# Patient Record
Sex: Male | Born: 1935 | Race: White | Hispanic: No | Marital: Married | Smoking: Former smoker
Health system: Southern US, Community
[De-identification: ages and names within clinical notes are randomized; demographics above are authoritative.]

## PROBLEM LIST (undated history)

## (undated) DIAGNOSIS — J449 Chronic obstructive pulmonary disease, unspecified: Secondary | ICD-10-CM

## (undated) DIAGNOSIS — E119 Type 2 diabetes mellitus without complications: Secondary | ICD-10-CM

## (undated) DIAGNOSIS — R0600 Dyspnea, unspecified: Secondary | ICD-10-CM

## (undated) DIAGNOSIS — R918 Other nonspecific abnormal finding of lung field: Secondary | ICD-10-CM

## (undated) DIAGNOSIS — J8489 Other specified interstitial pulmonary diseases: Secondary | ICD-10-CM

## (undated) DIAGNOSIS — I10 Essential (primary) hypertension: Secondary | ICD-10-CM

## (undated) DIAGNOSIS — K219 Gastro-esophageal reflux disease without esophagitis: Secondary | ICD-10-CM

## (undated) DIAGNOSIS — J9610 Chronic respiratory failure, unspecified whether with hypoxia or hypercapnia: Secondary | ICD-10-CM

## (undated) DIAGNOSIS — C801 Malignant (primary) neoplasm, unspecified: Secondary | ICD-10-CM

## (undated) DIAGNOSIS — J45909 Unspecified asthma, uncomplicated: Secondary | ICD-10-CM

---

## 1999-02-07 ENCOUNTER — Encounter: Payer: Self-pay | Admitting: Internal Medicine

## 1999-02-07 ENCOUNTER — Ambulatory Visit (HOSPITAL_COMMUNITY): Admission: RE | Admit: 1999-02-07 | Discharge: 1999-02-07 | Payer: Self-pay | Admitting: Internal Medicine

## 2000-02-07 ENCOUNTER — Encounter: Admission: RE | Admit: 2000-02-07 | Discharge: 2000-02-07 | Payer: Self-pay | Admitting: Internal Medicine

## 2000-02-07 ENCOUNTER — Encounter: Payer: Self-pay | Admitting: Internal Medicine

## 2000-04-06 ENCOUNTER — Ambulatory Visit (HOSPITAL_COMMUNITY): Admission: RE | Admit: 2000-04-06 | Discharge: 2000-04-06 | Payer: Self-pay | Admitting: *Deleted

## 2000-08-28 ENCOUNTER — Encounter: Admission: RE | Admit: 2000-08-28 | Discharge: 2000-08-28 | Payer: Self-pay | Admitting: *Deleted

## 2000-09-18 ENCOUNTER — Ambulatory Visit (HOSPITAL_COMMUNITY): Admission: RE | Admit: 2000-09-18 | Discharge: 2000-09-18 | Payer: Self-pay | Admitting: *Deleted

## 2000-10-04 ENCOUNTER — Encounter: Admission: RE | Admit: 2000-10-04 | Discharge: 2000-10-04 | Payer: Self-pay | Admitting: Internal Medicine

## 2000-10-04 ENCOUNTER — Encounter: Payer: Self-pay | Admitting: Internal Medicine

## 2000-10-15 ENCOUNTER — Encounter: Payer: Self-pay | Admitting: Internal Medicine

## 2000-10-15 ENCOUNTER — Encounter: Admission: RE | Admit: 2000-10-15 | Discharge: 2000-10-15 | Payer: Self-pay | Admitting: Internal Medicine

## 2000-11-08 ENCOUNTER — Ambulatory Visit: Admission: RE | Admit: 2000-11-08 | Discharge: 2000-11-08 | Payer: Self-pay | Admitting: Pulmonary Disease

## 2003-08-25 ENCOUNTER — Encounter: Admission: RE | Admit: 2003-08-25 | Discharge: 2003-08-25 | Payer: Self-pay | Admitting: Internal Medicine

## 2003-09-01 ENCOUNTER — Encounter: Admission: RE | Admit: 2003-09-01 | Discharge: 2003-09-01 | Payer: Self-pay | Admitting: Internal Medicine

## 2003-09-12 ENCOUNTER — Inpatient Hospital Stay (HOSPITAL_COMMUNITY): Admission: EM | Admit: 2003-09-12 | Discharge: 2003-09-18 | Payer: Self-pay | Admitting: Emergency Medicine

## 2003-09-17 ENCOUNTER — Encounter (INDEPENDENT_AMBULATORY_CARE_PROVIDER_SITE_OTHER): Payer: Self-pay | Admitting: *Deleted

## 2004-05-04 ENCOUNTER — Ambulatory Visit: Payer: Self-pay | Admitting: Pulmonary Disease

## 2004-06-07 ENCOUNTER — Ambulatory Visit: Payer: Self-pay | Admitting: Pulmonary Disease

## 2004-06-14 ENCOUNTER — Ambulatory Visit: Payer: Self-pay | Admitting: Pulmonary Disease

## 2004-06-23 ENCOUNTER — Ambulatory Visit: Payer: Self-pay | Admitting: Pulmonary Disease

## 2004-07-07 ENCOUNTER — Ambulatory Visit: Payer: Self-pay | Admitting: Pulmonary Disease

## 2004-08-05 ENCOUNTER — Ambulatory Visit: Payer: Self-pay | Admitting: Pulmonary Disease

## 2004-09-09 ENCOUNTER — Ambulatory Visit: Payer: Self-pay | Admitting: Cardiology

## 2004-09-15 ENCOUNTER — Ambulatory Visit: Payer: Self-pay | Admitting: Pulmonary Disease

## 2004-10-14 ENCOUNTER — Ambulatory Visit: Payer: Self-pay | Admitting: Pulmonary Disease

## 2004-10-17 ENCOUNTER — Encounter: Admission: RE | Admit: 2004-10-17 | Discharge: 2004-10-17 | Payer: Self-pay | Admitting: Internal Medicine

## 2004-10-31 ENCOUNTER — Encounter: Admission: RE | Admit: 2004-10-31 | Discharge: 2004-10-31 | Payer: Self-pay | Admitting: Internal Medicine

## 2004-11-01 ENCOUNTER — Ambulatory Visit: Payer: Self-pay | Admitting: Pulmonary Disease

## 2004-11-15 ENCOUNTER — Ambulatory Visit: Payer: Self-pay | Admitting: Pulmonary Disease

## 2004-11-29 ENCOUNTER — Ambulatory Visit: Payer: Self-pay | Admitting: Internal Medicine

## 2004-12-15 ENCOUNTER — Ambulatory Visit: Payer: Self-pay | Admitting: Pulmonary Disease

## 2005-01-05 ENCOUNTER — Ambulatory Visit: Payer: Self-pay | Admitting: Pulmonary Disease

## 2005-01-23 ENCOUNTER — Ambulatory Visit: Payer: Self-pay | Admitting: Internal Medicine

## 2005-02-10 ENCOUNTER — Ambulatory Visit: Payer: Self-pay | Admitting: Internal Medicine

## 2005-02-27 ENCOUNTER — Ambulatory Visit: Payer: Self-pay | Admitting: Pulmonary Disease

## 2005-03-20 ENCOUNTER — Ambulatory Visit: Payer: Self-pay | Admitting: Pulmonary Disease

## 2005-04-03 ENCOUNTER — Ambulatory Visit: Payer: Self-pay | Admitting: Pulmonary Disease

## 2005-04-20 ENCOUNTER — Ambulatory Visit: Payer: Self-pay | Admitting: Pulmonary Disease

## 2005-04-21 ENCOUNTER — Ambulatory Visit: Payer: Self-pay | Admitting: Internal Medicine

## 2005-05-08 ENCOUNTER — Ambulatory Visit: Payer: Self-pay | Admitting: Pulmonary Disease

## 2005-05-23 ENCOUNTER — Ambulatory Visit: Payer: Self-pay | Admitting: Pulmonary Disease

## 2005-07-12 ENCOUNTER — Ambulatory Visit: Payer: Self-pay | Admitting: Pulmonary Disease

## 2005-08-30 ENCOUNTER — Ambulatory Visit: Payer: Self-pay | Admitting: Pulmonary Disease

## 2005-12-04 ENCOUNTER — Ambulatory Visit: Payer: Self-pay | Admitting: Pulmonary Disease

## 2006-01-29 ENCOUNTER — Ambulatory Visit: Payer: Self-pay | Admitting: Internal Medicine

## 2006-02-26 ENCOUNTER — Ambulatory Visit: Payer: Self-pay | Admitting: Pulmonary Disease

## 2006-02-28 ENCOUNTER — Ambulatory Visit: Payer: Self-pay | Admitting: Cardiology

## 2006-03-19 ENCOUNTER — Ambulatory Visit: Payer: Self-pay | Admitting: Pulmonary Disease

## 2006-04-11 ENCOUNTER — Ambulatory Visit: Payer: Self-pay | Admitting: Pulmonary Disease

## 2006-05-17 ENCOUNTER — Ambulatory Visit: Payer: Self-pay | Admitting: Pulmonary Disease

## 2006-06-18 ENCOUNTER — Ambulatory Visit: Payer: Self-pay | Admitting: Pulmonary Disease

## 2006-07-25 ENCOUNTER — Ambulatory Visit: Payer: Self-pay | Admitting: Pulmonary Disease

## 2006-08-07 ENCOUNTER — Ambulatory Visit: Payer: Self-pay | Admitting: Pulmonary Disease

## 2006-08-21 ENCOUNTER — Ambulatory Visit: Payer: Self-pay | Admitting: Pulmonary Disease

## 2006-09-26 ENCOUNTER — Ambulatory Visit: Payer: Self-pay | Admitting: Pulmonary Disease

## 2006-10-26 ENCOUNTER — Ambulatory Visit: Payer: Self-pay | Admitting: Pulmonary Disease

## 2006-12-27 ENCOUNTER — Ambulatory Visit: Payer: Self-pay | Admitting: Emergency Medicine

## 2007-01-23 ENCOUNTER — Ambulatory Visit: Payer: Self-pay | Admitting: Internal Medicine

## 2007-01-29 ENCOUNTER — Ambulatory Visit: Payer: Self-pay | Admitting: Emergency Medicine

## 2007-03-04 ENCOUNTER — Ambulatory Visit: Payer: Self-pay | Admitting: Emergency Medicine

## 2007-04-18 ENCOUNTER — Ambulatory Visit: Payer: Self-pay | Admitting: Emergency Medicine

## 2007-05-13 ENCOUNTER — Ambulatory Visit: Payer: Self-pay | Admitting: Emergency Medicine

## 2007-05-21 ENCOUNTER — Encounter: Admission: RE | Admit: 2007-05-21 | Discharge: 2007-05-21 | Payer: Self-pay | Admitting: Internal Medicine

## 2007-05-21 DIAGNOSIS — J8489 Other specified interstitial pulmonary diseases: Secondary | ICD-10-CM | POA: Insufficient documentation

## 2007-06-18 ENCOUNTER — Ambulatory Visit: Payer: Self-pay | Admitting: Emergency Medicine

## 2007-07-19 ENCOUNTER — Ambulatory Visit: Payer: Self-pay | Admitting: Emergency Medicine

## 2007-09-05 ENCOUNTER — Encounter: Payer: Self-pay | Admitting: Internal Medicine

## 2007-09-05 ENCOUNTER — Ambulatory Visit: Payer: Self-pay | Admitting: Emergency Medicine

## 2007-09-11 DIAGNOSIS — J45909 Unspecified asthma, uncomplicated: Secondary | ICD-10-CM | POA: Insufficient documentation

## 2008-07-22 ENCOUNTER — Ambulatory Visit: Payer: Self-pay | Admitting: Emergency Medicine

## 2009-06-21 ENCOUNTER — Ambulatory Visit: Payer: Self-pay | Admitting: Emergency Medicine

## 2010-07-12 NOTE — Miscellaneous (Signed)
Summary: Injection Record / [facility]    Injection Record / [facility]    Imported By: Lennie Odor 11/01/2009 15:48:43  _____________________________________________________________________  External Attachment:    Type:   Image     Comment:   External Document

## 2010-07-12 NOTE — Assessment & Plan Note (Signed)
Summary: COP, asthma   Visit Type:  Follow-up  CC:  Asthma follow-up. Last seen 07/2008. Patient states he is having no problems with his breathing and no asthma flare-ups. 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 Formerly on Xolair (stopped 6/09).   ROV 06/21/09 -- returns today for f/u, last seen a yr ago. Maintained on 2.5mg  once daily of prednisone. has not required any extra pred or abx since last visit. Activity may be a bit limited as much due to joint pain as to breathing. On foradil + by mouth steroids.      Current Medications (verified): 1)  Omeprazole 20 Mg  Cpdr (Omeprazole) .... Take 1 Tablet By Mouth Once A Day 2)  Simvastatin 80 Mg  Tabs (Simvastatin) .... Take 1 Tablet By Mouth Once A Day 3)  Foradil Aerolizer 12 Mcg  Caps (Formoterol Fumarate) .... Take 1 Capsule By Mouth Once A Day 4)  Lisinopril 40 Mg  Tabs (Lisinopril) .... Take 1 Tablet By Mouth Once A Day 5)  Trazodone Hcl 50 Mg  Tabs (Trazodone Hcl) .... Take 1 Tab By Mouth At Bedtime 6)  Diazepam 5 Mg  Tabs (Diazepam) .... As Needed 7)  Metoprolol Tartrate .... Take 1 Tablet By Mouth Two Times A Day 8)  Prednisone 5 Mg Tabs (Prednisone) .... Take 1/2 Tablet Once Daily 9)  Novolin N 100 Unit/ml  Susp (Insulin Isophane Human) .... Two Times A Day  Allergies (verified): 1)  ! Ceclor  Vital Signs:  Patient profile:   77 year old male Height:      70 inches (177.80 cm) Weight:      194 pounds (88.18 kg) BMI:     27.94 O2 Sat:      93 % on Room air Temp:     97.7 degrees F (36.50 degrees C) oral Pulse rate:   72 / minute BP sitting:   140 / 96  (left arm) Cuff size:   regular  Vitals Entered By: Michel Bickers CMA (June 21, 2009 10:47 AM)  O2 Flow:  Room air  Physical Exam  General:  cushingoid appearance Head:   normocephalic and atraumatic Nose:  no deformity, discharge, inflammation, or lesions Mouth:  no deformity or lesions Lungs:  clear bilaterally Heart:  regular rate and rhythm, S1, S2 without murmurs, rubs, gallops, or clicks Abdomen:  not examined Msk:  no deformity or scoliosis noted with normal posture Extremities:  no clubbing, cyanosis, edema, or deformity noted Skin:  intact without lesions or rashes   Impression & Recommendations:  Problem # 1:  EXTRINSIC ASTHMA, UNSPECIFIED (ICD-493.00) Continue Prednisone + foradil F/u in a yr or as needed   Problem # 2:  OTH SPEC ALVEOL&PARIETOALVEOL PNEUMONOPATHIES (ICD-516.8) CXR next time  Other Orders: Est. Patient Level III (58527)  Patient Instructions: 1)  Continue you prednisone 2.5mg  by mouth once daily  2)  Continu eyour foradil two times a day  3)  Follow up with Dr Delton Coombes in 1 yr or as needed.  4)  We will perform a CXR at your next visit. Prescriptions: PREDNISONE 5 MG TABS (PREDNISONE) Take 1/2 tablet once daily  #50 x 3   Entered and Authorized by:   Albert Peer MD   Signed by:   Albert Peer MD on 06/21/2009   Method used:  Electronically to        [facility] (retail)       [address]       [place], [place]  [address]       Ph: [phone] or [phone]       Fax: [phone]   RxID:   [id number]

## 2010-10-25 NOTE — Assessment & Plan Note (Signed)
Albert Hansen                             PULMONARY OFFICE NOTE   NAME:HANESRolin, Schult                          MRN:          [id number]  DATE:10/26/2006                            DOB:          04-15-1930    This is a very pleasant, 77 year old gentleman who I follow her for  relapsing BOOP. He also has asthma with asthmatic bronchitic and chronic  obstructive pulmonary disease features. This is IgE-mediated and he is  currently maintained on Xolair. The patient has been able to decrease  his prednisone steadily. He is currently now on 2.5 mg daily. He has not  been able to decrease further than 2.5 mg per day otherwise he starts  feeling down in the dumps. This is however significantly reduced from  his prior dosages. He has been up to as high as 40 mg daily.   CURRENT MEDICATIONS:  As noted on the intake sheet. These have been  reviewed and are accurate.   PHYSICAL EXAMINATION:  VITAL SIGNS:  As noted. Oxygen saturation is 96%  on room air.  GENERAL:  This is a well-developed, well nourished gentleman who is in  no acute distress  HEENT:  Unremarkable.  NECK:  Supple, no adenopathy noted. No JVD.  LUNGS:  Clear to auscultation bilaterally.  CARDIAC:  Regular rate and rhythm, no rubs, murmurs or gallops heard.  EXTREMITIES:  The patient has no cyanosis, no clubbing, no edema noted.   IMPRESSION:  1. Relapsing bronchitis obliterans with organized pneumonia. The      patient appears to be stable in this regard.  2. 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 The patient also well compensated in this regard.   PLAN:  1. Continue Xolair 225 mcg per month.  2. Continue Spiriva, Foradil and Pulmicort.  3. Continue prednisone at 2.5 mg daily. We will continue this for      approximately 2 months and then start decreasing further to 2.5      every other day to see if we can wean him very slowly.  4. The patient had a  chest x-ray today which showed no acute      infiltrate and just lingular scarring as previously.  5. Followup will be in 2 months' time with Dr. Levy Pupa. He is to      contact us prior to that time should any problems arise.     Gailen Shelter, MD  Electronically Signed    CLG/MedQ  DD: 10/26/2006  DT: 10/26/2006  Job #: [id number]

## 2010-10-25 NOTE — Assessment & Plan Note (Signed)
[facility]                             PULMONARY OFFICE NOTE   NAME:Albert Hansen, Albert Hansen                          MRN:          [id number]  DATE:01/29/2007                            DOB:          10-28-1929    SUBJECTIVE:  Mr. Chalfin is a 77 year old man who has been followed by Dr.  Danice Goltz in our office for dyspnea that has been ascribed to  episodic relapsing cryptogenic organizing pneumonia. He also has airflow  limitation based on pulmonary function testing from October 2006 which  has been quantified as mild. It was thought by Dr. Jayme Cloud that this  was for the most part IGE mediated. He has had good response to his  current regimen which includes Xolair injections, prednisone 2.5 mg  daily, which he has been on for four years, inhaled Pulmicort, Spiriva,  and Foradil. Attempts have been made in the past to wean him off of  prednisone. On the most recent attempts to do so, he has not able to  tolerate this due to episodes of gout, and signs and symptoms consistent  with adrenal insufficiency. On at least some occasions in the past, he  has developed pulmonary infiltrates when his prednisone was withdrawn.  He has been on the Xolair for about one year. He felt like that his  breathing was more erratic when he was off the Xolair. He continues to  have shortness of breath when he exerts himself heavily, especially when  he exerts himself in the upper body. He is able to walk indefinitely. He  is not reliably taking his Pulmicort. He does take Foradil and Spiriva  fairly reliably.   CURRENT MEDICATIONS:  1. Metoprolol dose unknown twice daily.  2. Pulmicort b.i.d.  3. Spiriva 1 inhalation daily.  4. Omeprazole 20 mg daily.  5. Simvastatin 80 mg nightly.  6. Prednisone 2.5 mg daily.  7. Novolin-N 20 units b.i.d.  8. Novolin-R sliding scale insulin.  9. Foradil 1 inhalation daily.  10.Lisinopril 40 mg daily.  11.Xolair every 4 weeks.  12.Trazodone 50 mg nightly.  13.Hydrocodone p.r.n.  14.Diazepam p.r.n.   PHYSICAL EXAMINATION:  GENERAL:  This is a cushingoid appearing,  otherwise comfortable gentleman in no distress.  VITAL SIGNS:  Weight 193 pounds, temperature 98.2, blood pressure  132/80, heart rate 79, SPO2 95% on room air.  HEENT:  Posterior pharynx is benign.  NECK:  Supple without lymphadenopathy or strider. He has cushingoid  fasces.  LUNGS:  Somewhat distant, but for the most part clear. I do not hear any  wheezing.  HEART:  Regular without murmur.  ABDOMEN:  Obese, soft, nontender with positive bowel sounds.  EXTREMITIES:  Scattered ecchymoses, but only trace edema.   Chest x-ray performed on 8/19 shows mild perihilar densities that are  improved compared with prior studies. He has scarring at his left base  that is stable from prior studies. He has no new infiltrates or  effusions.   IMPRESSION:  1. History of BOOP that has been relapsing in nature when cortical      steroids have  been withdrawn.  2. Mild airflow limitation without a bronchodilator response which is      currently being treated with chronic steroids, Spiriva, Foradil,      and occasional Pulmicort. He is also being treated with Xolair 225      mcg each month. At this time, I would like to discontinue his      Foradil and Pulmicort, as I do not see any bronchodilator      responsiveness on his most recent PFTs. I will continue his      prednisone 2.5 mg for now, but I would like to make a goal of      weaning this to off if possible. This will be determined by whether      or not he shows signs or symptoms of adrenal insufficiency or      whether he develops recurrent infiltrates consistent with COP. I      will continue his Xolair for now, but I question whether he has      true asthma. His IGE based on my review of his labs has never been      significantly elevated. It was 264 in July 2005. His airflow      limitation is mild  and he does not have a bronchodilator response      on recent PFTs. It may be that the Xolair can be discontinued. I      will continue him on his Spiriva as ordered and follow up with him      in 4 to 6 weeks to see if he tolerated the discontinuation of his      Foradil and Pulmicort.     Leslye Peer, MD  Electronically Signed    RSB/MedQ  DD: 03/08/2007  DT: 03/09/2007  Job #: [id number]

## 2010-10-25 NOTE — Assessment & Plan Note (Signed)
[facility]                             PULMONARY OFFICE NOTE   NAME:HANESKiowa, Hollar                          MRN:          [id number]  DATE:03/04/2007                            DOB:          1929/11/17    SUBJECTIVE:  Mr. Avino is a 77 year old man who follows up today for a  regularly scheduled visit regarding a history of airflow limitation and  episodic cryptogenic organizing pneumonia that has been steroid  dependent. He tells me today that he was angered by our last visit and  that he is frustrated with my questions related to his current medical  regimen. In particular, he was angry that I questioned whether he could  possibly come of his Xolair and/or prednisone. He is interested in  possibly changing physicians to Dr. Shelle Iron in our office. He tells me  that his breathing is doing about the same. He has not noticed any  significant change since our last visit. He did discontinue Pulmicort as  I had asked him. He stayed on the Foradil and Spiriva. He has also  continued to take his prednisone 2.5 mg daily.   CURRENT MEDICATIONS:  1. Metoprolol dose unknown b.i.d.  2. Spiriva 1 inhalation daily.  3. Omeprazole 20 mg daily.  4. Simvastatin 80 mg daily.  5. Prednisone 2.5 mg daily.  6. Novolin-N 20 units b.i.d.  7. Novolin-R sliding scale insulin.  8. Foradil 1 inhalation daily.  9. Lisinopril 40 mg daily.  10.Xolair injections every 4 weeks.  11.Trazodone 50 mg nightly.  12.Hydrocodone p.r.n.  13.Diazepam p.r.n.   PHYSICAL EXAMINATION:  Unchanged from our previous visit one month  prior.  VITAL SIGNS:  Weight 193 pounds, blood pressure 98.2, blood pressure  128/74, heart rate 93, SPO2 94% on room air.   IMPRESSION:  History of relapsing cryptogenic organizing pneumonia that  has recurred when his steroids have been withdrawn in the past. It may  still be possible to wean his prednisone to off, but we would have to  follow him closely for  possible recurrence of pulmonary infiltrates.  Probably more complicating will be the fact that he will likely  experience signs and symptoms consistent with adrenal insufficiency. He  has also had difficulty with gout when his steroids have been withdrawn.  At this time, it is quite clear to me that Mr. Schoenberg does not want to  come off of steroids at this time. He also wants to continue his Foradil  and Spiriva. With regard to his airflow limitation, again it is not  clear to me whether he needs Xolair in addition to his current  bronchodilator regimen. We have discussed possibly coming off of the  Xolair and he is very resistant to this.   At this time, I will keep him on his current regimen, but I would like  to continue discussions about the necessity of this medication. I will  consider redrawing his IGE or even holding the  medication and checking his IGE to see if he truly has an IGE component.  If Mr. Worthing is resistant  to this approach, I will consider referring  him to Dr. Shelle Iron as he has requested to get an independent opinion  regarding the utility of these medications.     Leslye Peer, MD  Electronically Signed    RSB/MedQ  DD: 03/08/2007  DT: 03/09/2007  Job #: [id number]

## 2010-10-28 NOTE — Op Note (Signed)
NAME:  Albert Hansen, Albert Hansen NO.:  [id number]   MEDICAL RECORD NO.:  [id number]                   PATIENT TYPE:  INP   LOCATION:  5025                                 FACILITY:  [facility]   PHYSICIAN:  Danice Goltz, M.D. [facility]            DATE OF BIRTH:  Jan 17, 1930   DATE OF PROCEDURE:  09/17/2003  DATE OF DISCHARGE:                                 OPERATIVE REPORT   PROCEDURE:  Video bronchoscopy.   INDICATIONS FOR PROCEDURE:  Persistent bacillar infiltrates, in a patient  with recent pneumonia -- which appears not to resolve.   BRIEF HISTORY:  Albert Hansen is a 77 year old white male with a known history  of COPD, who presented with a non resolving pneumonia.  The patient  continued to show evidence of infiltration, despite antibiotics.  The  patient was given a trial of steroids.  The patient had bronchoscopy done  because of concern for potential bronchoalveolar cells.   Current medications are as noted on the [facility], these were reviewed.  The  patient was examined and was suitable for IV conscious sedation.   The patient had the procedure explained, with limitations, benefits and  potential complications explained.  He agreed to proceed.   DESCRIPTION OF PROCEDURE:  The patient was taken to the endoscopy suite,  where he was premedicated with Versed 5 mg, fentanyl 75 mcg.  He had  Cetacaine spray anesthesia of the posterior pharynx.  Lidocaine 2% was used  for the procedure, a total of 30 cc for local and the bronchial anesthesia.  The Olympus video bronchoscope was then passed via the oral route.  The  vocal cords were noted to be normal.  Trachea was normal.  Carina was sharp.   The right tracheobronchial tube was then examined.  The right upper lobe had  four subsegments, which this is an anatomic variant.  The right middle lobe  and right lower lobe subsegments were also inspected; no endobronchial  lesions were noted. Chronic bronchitic changes were  noted.  The patient did  have bronchiectatic change of the lower lobes.   The bronchoscope at this point was brought to the left tracheobronchial  tree.  The left upper lobe and lingular subsegments were normal.  No  endobronchial lesions.  The attention was then placed to a left lower lobe,  and again no endobronchial lesions were noted.  The patient has some  bronchiectatic and chronic bronchitic changes noted.   At this point, under fluoroscopic guidance, transbronchial biopsies were  done of the left lower lobe.  A total of two passes with a brush and a five  separate transbronchial biopsies were obtained with fluoroscopic guidance.  The patient tolerated this very well.   Prior to biopsy, this subsegment was blanched with dilute solution of  methy__________.  Washings were also obtained.   At this point, ensuring that there had been adequate hemostasis, the  procedure was terminated.   IMPRESSION:  1. Non resolving infiltrates of the left lower lobe greater than right lower     lobe; rule out bronchoalveolar cell.  2. No overt abnormalities noted on airway examination.   PLAN:  Will be as noted on the patient's inpatient orders.  The patient will  continue on antibiotics and steroids.  Continue pulmonary toilet.  If the  patient remains clinically stable, can be discharged to home in the morning.   DISPOSITION:  The patient tolerated the procedure well.  A chest x-ray is  pending and the patient was taken to the recovery area without incident.                                               Danice Goltz, M.D. [facility]    LG/MEDQ  D:  09/17/2003  T:  09/18/2003  Job:  [id number]   cc:   Wilson Singer, M.D.  [address]  [place]  [place] [address]  Fax: [phone]

## 2010-10-28 NOTE — Procedures (Signed)
[facility]  Patient:    Albert Hansen, Albert Hansen                        MRN: [id number] Proc. Date: 04/06/00 Adm. Date:  [id number] Attending:  Will Bonnet CC:         Lilly Cove, M.D.   Procedure Report  DATE OF BIRTH:  [birth year].  PROCEDURE:  Colonoscopy.  ENDOSCOPIST:  Anselmo Rod, M.D.  INSTRUMENT USED:  Olympus video colonoscope.  INDICATIONS FOR PROCEDURE:  Rectal bleeding in a 77 year old white male.  Rule out colonic polyps, masses, hemorrhoids, etc.  PREPROCEDURE PREPARATION:  Informed consent was procured from the patient. The patient was fasted for eight hours prior to the procedure and prepped with a bottle of magnesium citrate and a gallon of NuLytely the night prior to the procedure.  PREPROCEDURE PHYSICAL:  VITAL SIGNS:  Patient with stable vital signs.  NECK:  Supple.  CHEST:  Clear to auscultation.  S1, S2 regular.  ABDOMEN:  Soft with normal abdominal bowel sounds.  DESCRIPTION OF PROCEDURE:  The patient was placed in the left lateral decubitus position and sedated with 50 mg of Demerol and 4 mg of Versed intravenously.  Once the patient was adequately sedate and maintained on low-flow oxygen and continuous cardiac monitoring, the Olympus video colonoscope was advanced from the rectum to the cecum without difficulty.  The patient had a fairly good prep.  Except for small nonbleeding internal hemorrhoids, no abnormalities were seen.  The patient tolerated the procedure well without complications.  No masses, polyps, erosions, ulcerations, or diverticula were seen.  IMPRESSION:  Normal colonoscopy except for small nonbleeding internal hemorrhoids.  RECOMMENDATIONS:  The patient is advised to increase the fluid and fiber in his diet and to follow up with Dr. Lilly Cove and is to contact me for further GI problems on a p.r.n. basis if the need arises. DD:  04/06/00 TD:  04/06/00 Job:  [id number]

## 2010-10-28 NOTE — Assessment & Plan Note (Signed)
[facility]                             PULMONARY OFFICE NOTE   NAME:HANESDarric, Plante                          MRN:          [id number]  DATE:08/21/2006                            DOB:          11/01/1929    FOLLOWUP NOTE.   This is a very pleasant 77 year old gentleman who I follow here for  relapsing BOOP and IgE-mediated asthma.  He presents today for followup.  We saw him on the 13th of February.  At that time he had had an episode  with flare-up but had been recovering.  He presents today for followup.  He voices no complaints.  He actually had tapered himself completely off  of prednisone, which was not the instructions I had given him during the  last visit, and then he developed problems with gouty flare.  He then  increased his prednisone and now is tapered off to 5 mg daily.  He wants  to get off completely of prednisone, he has felt that he has been doing  very well, particularly since starting Xolair.   CURRENT MEDICATIONS:  Are as noted on the Intake Sheet, these have been  reviewed and are accurate.   PHYSICAL EXAM:  VITAL SIGNS:  Noted.  Oxygen saturation is 97% on room  air.  GENERAL:  This a well-developed, somewhat obese gentleman who is in no  acute distress.  HEENT:  Unremarkable.  NECK:  Supple, no adenopathy noted, no JVD.  LUNGS:  Clear to auscultation bilaterally.  CARDIAC:  Regular rate, rhythm.  No rubs, murmurs or gallops.  EXTREMITIES:  Patient has no cyanosis, no clubbing, no edema noted.   We did perform chest x-ray today which was entirely clear.   IMPRESSION:  1. Relapsing bronchiolitis obliterans with organizing pneumonia.  The      patient is very well compensated at present and actually doing very      well without any relapse.  2. IgE-mediated asthma, very well compensated, currently on Xolair.   PLAN:  1. Decrease prednisone to 5 mg every other day for 2 weeks.  If he      tolerates this, will do 5 mg three  times weekly times two weeks.      If he does well with this, will stop the prednisone all together.  2. Followup will be around end of May.  He is to contact us prior to      that time should any new problems arise.  I have also informed him      that I will be leaving the practice.  I will, after his next visit,      refer him to Dr. Levy Pupa for further followup.    Gailen Shelter, MD  Electronically Signed   CLG/MedQ  DD: 08/21/2006  DT: 08/23/2006  Job #: [id number]

## 2010-10-28 NOTE — Assessment & Plan Note (Signed)
[facility]                             PULMONARY OFFICE NOTE   NAME:Albert Hansen, Albert Hansen                          MRN:          [id number]  DATE:07/25/2006                            DOB:          [birth year]    [name] is a 77 year old white man with past medical history of  BOOP, asthma, and insomnia who comes today for a followup on his BOOP.  He said that 5 weeks ago he had an upper respiratory infection, and then  he started to cough and have a lot of purulent sputum.  He took 7 Avelox  he had, and also increased his prednisone to 20 mg a day.  [place] he [place] to see a physician who prescribed Levaquin  750 daily for 10 days plus Mucinex and some cough syrup.  He said that  he was very sick for a while coughing up a lot of purulent sputum, but  now he is doing much better.  Actually, all of his symptoms are resolved  except for some weakness.  Currently, he is taking prednisone 5 mg  daily, and he is using his regular medications.  He denies any current  fever, chills, chest pain, or purulent sputum.   PAST MEDICAL HISTORY:  Reviewed.   MEDICATIONS:  Reviewed.   PHYSICAL EXAMINATION:  Weight 190.  Temperature 98.3.  Blood pressure  140/76.  Pulse 88.  Oxygen saturation 94% on room air.  HEENT:  His pharynx is clear.  NECK:  Supple.  No JVD.  LUNGS:  Clear to auscultation bilaterally.  HEART:  Regular rate and rhythm with no murmurs.  ABDOMEN:  Soft and non-tender.  Bowel sounds are positive.  LOWER EXTREMITIES:  With no edema.   IMPRESSION:  Resolved upper respiratory infection plus probable  pneumonia.   PLAN:  Continue with regular medications.  Also, he is to continue  prednisone 5 mg daily for a week, and then decrease it to 5 mg p.o.  every other day.  He is to come back in 4 to 6 weeks for followup when a  new chest x-ray is going to be done.  Of note, the chest done today was  clear.      Dennis Bast, MD      C. Danice Goltz, MD  Electronically Signed   Rivka Safer  DD: 07/25/2006  DT: 07/25/2006  Job #: [id number]

## 2010-10-28 NOTE — Discharge Summary (Signed)
NAME:  Albert Hansen, Albert Hansen                           ACCOUNT NO.:  [id number]   MEDICAL RECORD NO.:  [id number]                   PATIENT TYPE:  INP   LOCATION:  5025                                 FACILITY:  [facility]   PHYSICIAN:  Wilson Singer, M.D.             DATE OF BIRTH:  02-05-1930   DATE OF ADMISSION:  09/12/2003  DATE OF DISCHARGE:  09/18/2003                                 DISCHARGE SUMMARY   FINAL DISCHARGE DIAGNOSES:  1. 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  2. Chronic obstructive pulmonary disease.  3. Diabetes mellitus.   CONDITION ON DISCHARGE:  Stable.   MEDICATIONS ON DISCHARGE:  1. Home medicines.  2. Augmentin 875 mg b.i.d. for ten days.  3. Prednisone taper over the next eight days.   HISTORY:  This 77 year old man was admitted with a reduced level of  consciousness.  He had been diagnosed with pneumonia as an outpatient and  had been on several courses of antibiotics.  Unfortunately, he did not seem  to improve and still continued to cough sputum and becoming more drowsy.  Please see initial history and physical examination, by Dr. Della Goo.   HOSPITAL PROGRESS:  He was admitted to the hospital and was seen by  pulmonary care who felt that he had a non-resolving pneumonia and that they  would re-treat him with antibiotics empirically.  Chest x-ray showed left  upper lobe, left lower lobe, and right lower lobe patches of consolidation.  He also had acute renal failure in the presence of chronic renal  insufficiency.  By September 14, 2003, he felt better and less confused and  bronchoscopy was entertained.  He eventually then underwent a bronchoscopy  whilst he was in the hospital which did not show any major abnormalities.  There was no tumor, and the impression was one of BOOP.  He did feel better  by September 18, 2003.  He had a few lung right base crackles, but he was able to  be discharged home on oral antibiotics and steroids.   He  will be seen in my office in two weeks and also will follow up with a  pulmonologist, Dr. Danice Goltz.                                                Wilson Singer, M.D.    NCG/MEDQ  D:  11/26/2003  T:  11/27/2003  Job:  [id number]

## 2010-11-30 ENCOUNTER — Other Ambulatory Visit: Payer: Self-pay | Admitting: Dermatology

## 2011-05-02 ENCOUNTER — Other Ambulatory Visit: Payer: Self-pay | Admitting: Internal Medicine

## 2011-05-02 DIAGNOSIS — M79606 Pain in leg, unspecified: Secondary | ICD-10-CM

## 2011-05-03 ENCOUNTER — Ambulatory Visit
Admission: RE | Admit: 2011-05-03 | Discharge: 2011-05-03 | Disposition: A | Discharge status: Home / Self Care | Payer: Medicare HMO | Source: Ambulatory Visit | Attending: Internal Medicine | Admitting: Internal Medicine

## 2011-05-03 DIAGNOSIS — M79606 Pain in leg, unspecified: Secondary | ICD-10-CM

## 2011-06-26 ENCOUNTER — Encounter: Payer: Self-pay | Admitting: Emergency Medicine

## 2011-06-26 ENCOUNTER — Other Ambulatory Visit: Payer: Self-pay | Admitting: Emergency Medicine

## 2011-06-26 DIAGNOSIS — J8409 Other alveolar and parieto-alveolar conditions: Secondary | ICD-10-CM

## 2011-06-27 ENCOUNTER — Encounter: Payer: Self-pay | Admitting: Emergency Medicine

## 2011-06-27 ENCOUNTER — Ambulatory Visit (INDEPENDENT_AMBULATORY_CARE_PROVIDER_SITE_OTHER): Payer: Medicare Other | Admitting: Emergency Medicine

## 2011-06-27 DIAGNOSIS — J8409 Other alveolar and parieto-alveolar conditions: Secondary | ICD-10-CM

## 2011-06-27 DIAGNOSIS — J45909 Unspecified asthma, uncomplicated: Secondary | ICD-10-CM

## 2011-06-27 NOTE — Assessment & Plan Note (Signed)
Currently stable - continue foradil

## 2011-06-27 NOTE — Patient Instructions (Signed)
Please continue your foradil twice a day Take your prednisone daily Follow with Dr Delton Coombes in 6 months with a CXR

## 2011-06-27 NOTE — Assessment & Plan Note (Signed)
-   same pred - repeat CXR in 6 months.

## 2011-06-27 NOTE — Progress Notes (Signed)
Mr. 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 Formerly on Xolair (stopped 6/09).   ROV 06/21/09 -- returns today for f/u, last seen a yr ago. Maintained on 2.5mg  once daily of prednisone. has not required any extra pred or abx since last visit. Activity may be a bit limited as much due to joint pain as to breathing. On foradil + by mouth steroids.   ROV 06/27/11 -- Hx asthma, also COP that has been steroid dependent (pred 2.5mg  qd). He was hospitalized 2 weeks ago in [place] with sinusitis and possible bronchitis. Was treated w abx and steroids. He is now feeling a bit better. Taking foradil. Needs a rescue SABA.    Gen: Pleasant, well-nourished, in no distress,  normal affect  ENT: No lesions,  mouth clear,  oropharynx clear, no postnasal drip  Neck: No JVD, no TMG, no carotid bruits  Lungs: No use of accessory muscles,   Cardiovascular: RRR, heart sounds normal, no murmur or gallops, no peripheral edema  Musculoskeletal: No deformities, no cyanosis or clubbing  Neuro: alert, non focal  Skin: Warm, no lesions or rashes   EXTRINSIC ASTHMA, UNSPECIFIED Currently stable - continue foradil  [facility] ALVEOL&PARIETOALVEOL PNEUMONOPATHIES - same pred - repeat CXR in 6 months.

## 2011-07-31 ENCOUNTER — Other Ambulatory Visit: Payer: Self-pay | Admitting: Emergency Medicine

## 2011-07-31 ENCOUNTER — Other Ambulatory Visit: Payer: Self-pay | Admitting: Dermatology

## 2011-09-21 ENCOUNTER — Telehealth: Payer: Self-pay | Admitting: Emergency Medicine

## 2011-09-22 NOTE — Telephone Encounter (Signed)
error 

## 2011-10-17 ENCOUNTER — Ambulatory Visit: Payer: Medicare Other | Admitting: Emergency Medicine

## 2011-10-27 ENCOUNTER — Ambulatory Visit (INDEPENDENT_AMBULATORY_CARE_PROVIDER_SITE_OTHER)
Admission: RE | Admit: 2011-10-27 | Discharge: 2011-10-27 | Disposition: A | Discharge status: Home / Self Care | Payer: Medicare Other | Source: Ambulatory Visit | Attending: Emergency Medicine | Admitting: Emergency Medicine

## 2011-10-27 ENCOUNTER — Encounter: Payer: Self-pay | Admitting: Emergency Medicine

## 2011-10-27 ENCOUNTER — Ambulatory Visit (INDEPENDENT_AMBULATORY_CARE_PROVIDER_SITE_OTHER): Payer: Medicare Other | Admitting: Emergency Medicine

## 2011-10-27 VITALS — BP 136/84 | HR 70 | Temp 98.1°F | Ht 70.0 in | Wt 190.0 lb

## 2011-10-27 DIAGNOSIS — J449 Chronic obstructive pulmonary disease, unspecified: Secondary | ICD-10-CM

## 2011-10-27 DIAGNOSIS — J8409 Other alveolar and parieto-alveolar conditions: Secondary | ICD-10-CM

## 2011-10-27 NOTE — Patient Instructions (Signed)
Please continue your current medications as you are taking them  Follow with Dr Wenceslaus Gist in 6 months or sooner if you have any problems  

## 2011-10-27 NOTE — Progress Notes (Signed)
Mr. 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 Formerly on Xolair (stopped 6/09).   ROV 06/21/09 -- returns today for f/u, last seen a yr ago. Maintained on 2.5mg  once daily of prednisone. has not required any extra pred or abx since last visit. Activity may be a bit limited as much due to joint pain as to breathing. On foradil + by mouth steroids.   ROV 06/27/11 -- Hx asthma, also COP that has been steroid dependent (pred 2.5mg  qd). He was hospitalized 2 weeks ago in [place] with sinusitis and possible bronchitis. Was treated w abx and steroids. He is now feeling a bit better. Taking foradil. Needs a rescue SABA.   ROV 10/27/11 -- Hx tobacco/COPD +/- asthma, also COP that has been steroid dependent (pred 2.5mg  qd).  CXR today without any significant infiltrates. On foradil, combivent pr. Also note on lisinopril. Followd by Dr Ludwig Clarks. Had an episode of cough, mucous, dyspnea in February, treated with levaquin, combivent. Not clear whether this was PNA, ? Flare of COP.     Filed Vitals:   10/27/11 1601  BP: 136/84  Pulse: 70  Temp: 98.1 F (36.7 C)   Gen: Pleasant, well-nourished, in no distress,  normal affect  ENT: No lesions,  mouth clear,  oropharynx clear, no postnasal drip  Neck: No JVD, no TMG, no carotid bruits  Lungs: No use of accessory muscles,   Cardiovascular: RRR, heart sounds normal, no murmur or gallops, no peripheral edema  Musculoskeletal: No deformities, no cyanosis or clubbing  Neuro: alert, non focal  Skin: Warm, no lesions or rashes   COPD (chronic obstructive pulmonary disease) COPD +/- asthma.  - offered to start him on Spiriva but he has had problems with this in past due to urinary retention.  - discussed treating allergies to make underlying lung dz easier to manage, he does not want to do this at this time  [facility] ALVEOL&PARIETOALVEOL PNEUMONOPATHIES Hx COP, on  chronic low dose pred because he has apparently flared when off. CXR today stable - discussed getting CT scan (last was in 2007) to assess parenchyma. He wants to defer, discuss next time.

## 2011-10-27 NOTE — Assessment & Plan Note (Signed)
COPD +/- asthma.  - offered to start him on Spiriva but he has had problems with this in past due to urinary retention.  - discussed treating allergies to make underlying lung dz easier to manage, he does not want to do this at this time

## 2011-10-27 NOTE — Assessment & Plan Note (Signed)
Hx COP, on chronic low dose pred because he has apparently flared when off. CXR today stable - discussed getting CT scan (last was in 2007) to assess parenchyma. He wants to defer, discuss next time.

## 2011-11-02 ENCOUNTER — Ambulatory Visit: Payer: Medicare Other | Admitting: Emergency Medicine

## 2012-03-15 ENCOUNTER — Ambulatory Visit: Payer: Medicare Other | Admitting: Emergency Medicine

## 2012-04-24 ENCOUNTER — Ambulatory Visit (INDEPENDENT_AMBULATORY_CARE_PROVIDER_SITE_OTHER): Payer: Medicare Other | Admitting: Emergency Medicine

## 2012-04-24 ENCOUNTER — Encounter: Payer: Self-pay | Admitting: Emergency Medicine

## 2012-04-24 VITALS — BP 124/78 | Temp 98.2°F | Resp 68 | Ht 62.0 in | Wt 191.6 lb

## 2012-04-24 DIAGNOSIS — J449 Chronic obstructive pulmonary disease, unspecified: Secondary | ICD-10-CM

## 2012-04-24 DIAGNOSIS — J8409 Other alveolar and parieto-alveolar conditions: Secondary | ICD-10-CM

## 2012-04-24 NOTE — Progress Notes (Signed)
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 Formerly on Xolair (stopped 6/09).   ROV 06/21/09 -- returns today for f/u, last seen a yr ago. Maintained on 2.5mg  once daily of prednisone. has not required any extra pred or abx since last visit. Activity may be a bit limited as much due to joint pain as to breathing. On foradil + by mouth steroids.   ROV 06/27/11 -- Hx asthma, also COP that has been steroid dependent (pred 2.5mg  qd). He was hospitalized 2 weeks ago in [place] with sinusitis and possible bronchitis. Was treated w abx and steroids. He is now feeling a bit better. Taking foradil. Needs a rescue SABA.   ROV 10/27/11 -- Hx tobacco/COPD +/- asthma, also COP that has been steroid dependent (pred 2.5mg  qd).  CXR today without any significant infiltrates. On foradil, combivent pr. Also note on lisinopril. Followd by Dr Ludwig Clarks. Had an episode of cough, mucous, dyspnea in February, treated with levaquin, combivent. Not clear whether this was PNA, ? Flare of COP.    ROV 04/24/12 -- Hx tobacco/COPD +/- asthma, also COP that has been steroid dependent (pred 2.5mg  qd). Last CT scan was in 2007. We talked about starting Spiriva, but he has had urinary retention in the past. He has allergies  - hasn't been on any therapy for this. He is on Combivent prn + foradil (only using qd). He believes that his breathing is stable.    Filed Vitals:   04/24/12 1443  BP: 124/78  Temp: 98.2 F (36.8 C)  Resp: 68   Gen: Pleasant, well-nourished, in no distress,  normal affect  ENT: No lesions,  mouth clear,  oropharynx clear, no postnasal drip  Neck: No JVD, no TMG, no carotid bruits  Lungs: No use of accessory muscles,   Cardiovascular: RRR, heart sounds normal, no murmur or gallops, no peripheral edema  Musculoskeletal: No deformities, no cyanosis or clubbing  Neuro: alert, non focal  Skin: Warm, no lesions or  rashes   COPD (chronic obstructive pulmonary disease) Atypical regimen of daily foradil and combivent prn. No flares or change in his breathing.  - continue same regimen  - rov 6 months  [facility] ALVEOL&PARIETOALVEOL PNEUMONOPATHIES - continue pred 2.5 qd - he had the flu shot already  - will defer CT scan of the chest unless clinically changes.

## 2012-04-24 NOTE — Assessment & Plan Note (Addendum)
-   continue pred 2.5 qd - he had the flu shot already  - will defer CT scan of the chest unless clinically changes.

## 2012-04-24 NOTE — Patient Instructions (Signed)
Please continue your Prednisone 2.5mg  daily Continue your foradil and combivent as you are taking them Follow with Dr Delton Coombes in 6 months or sooner if you have any problems

## 2012-04-24 NOTE — Assessment & Plan Note (Signed)
Atypical regimen of daily foradil and combivent prn. No flares or change in his breathing.  - continue same regimen  - rov 6 months

## 2012-07-15 ENCOUNTER — Telehealth: Payer: Self-pay | Admitting: Emergency Medicine

## 2012-07-15 NOTE — Telephone Encounter (Signed)
Pt aware will wait till RB sees pt on 2/6 and decide if CT is needed at that time--pt fine with this

## 2012-07-18 ENCOUNTER — Encounter: Payer: Self-pay | Admitting: Emergency Medicine

## 2012-07-18 ENCOUNTER — Ambulatory Visit (INDEPENDENT_AMBULATORY_CARE_PROVIDER_SITE_OTHER): Payer: Medicare Other | Admitting: Emergency Medicine

## 2012-07-18 VITALS — BP 136/80 | HR 74 | Temp 96.7°F | Ht 70.0 in | Wt 188.8 lb

## 2012-07-18 DIAGNOSIS — J8409 Other alveolar and parieto-alveolar conditions: Secondary | ICD-10-CM

## 2012-07-18 DIAGNOSIS — J449 Chronic obstructive pulmonary disease, unspecified: Secondary | ICD-10-CM

## 2012-07-18 NOTE — Progress Notes (Signed)
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 Formerly on Xolair (stopped 6/09).   ROV 10/27/11 -- Hx tobacco/COPD +/- asthma, also COP that has been steroid dependent (pred 2.5mg  qd).  CXR today without any significant infiltrates. On foradil, combivent pr. Also note on lisinopril. Followd by Dr Ludwig Clarks. Had an episode of cough, mucous, dyspnea in February, treated with levaquin, combivent. Not clear whether this was PNA, ? Flare of COP.    ROV 04/24/12 -- Hx tobacco/COPD +/- asthma, also COP that has been steroid dependent (pred 2.5mg  qd). Last CT scan was in 2007. We talked about starting Spiriva, but he has had urinary retention in the past. He has allergies  - hasn't been on any therapy for this. He is on Combivent prn + foradil (only using qd). He believes that his breathing is stable.   ROV 07/18/12 -- Hx tobacco/COPD +/- asthma, also COP that has been steroid dependent (pred 2.5mg  qd). He has been on regimen of foradil (usually only qd) and combivent prn. Returns today reporting that since about 6 weeks ago he has had more exertional dyspnea. He occasionally wheezes, but this is at night. He gets some relief from combivent. Currently on foradil qd. Dr Ludwig Clarks following his progressive renal insufficiency. He is worried about possible heart disease - Dr Judie Petit did ECG that was reassuring per pt   Filed Vitals:   07/18/12 1046  BP: 136/80  Pulse: 74  Temp: 96.7 F (35.9 C)   Gen: Pleasant, well-nourished, in no distress,  normal affect  ENT: No lesions,  mouth clear,  oropharynx clear, no postnasal drip  Neck: No JVD, no TMG, no carotid bruits  Lungs: No use of accessory muscles, some soft basilar rhonchi B  Cardiovascular: RRR, heart sounds normal, no murmur or gallops, no peripheral edema  Musculoskeletal: No deformities, no cyanosis or clubbing  Neuro: alert, non focal  Skin: Warm, no lesions or  rashes   [facility] SPE[facility] ALVEOL&PARIETOALVEOL PNEUMONOPATHIES - need to repeat CT scan to compare with priors.   COPD (chronic obstructive pulmonary disease) - repeat PFT - start doing foradil bid, use combivent prn  - walking oximetry today - if we don't get answers from our w/u then he may merit a cardiac stress test.

## 2012-07-18 NOTE — Assessment & Plan Note (Signed)
-   need to repeat CT scan to compare with priors.

## 2012-07-18 NOTE — Assessment & Plan Note (Signed)
-   repeat PFT - start doing foradil bid, use combivent prn  - walking oximetry today - if we don't get answers from our w/u then he may merit a cardiac stress test.

## 2012-07-18 NOTE — Addendum Note (Signed)
Addended by: Orma Flaming D on: 07/18/2012 11:33 AM   Modules accepted: Orders

## 2012-07-18 NOTE — Patient Instructions (Addendum)
Walking oximetry today showed you need 2l w exertion, orders placed Start using your foradil twice a day Continue to use your combivent as needed CT scan of the chest  Full PFT's at your return visit Follow with Dr Delton Coombes in 1 month with PFT

## 2012-07-23 ENCOUNTER — Ambulatory Visit (INDEPENDENT_AMBULATORY_CARE_PROVIDER_SITE_OTHER)
Admission: RE | Admit: 2012-07-23 | Discharge: 2012-07-23 | Disposition: A | Discharge status: Home / Self Care | Payer: Medicare Other | Source: Ambulatory Visit | Attending: Emergency Medicine | Admitting: Emergency Medicine

## 2012-07-23 DIAGNOSIS — J8409 Other alveolar and parieto-alveolar conditions: Secondary | ICD-10-CM

## 2012-07-24 ENCOUNTER — Telehealth: Payer: Self-pay | Admitting: Emergency Medicine

## 2012-07-24 NOTE — Telephone Encounter (Signed)
Spoke with the patient and reviewed th CT scan - much less inflammatory change than in 2007. He says he feels much better with good compliance with his O2, using foradil bid. At this time I do not believe we should treat with steroid burst. Will continue his usual dosing and follow him clinically.

## 2012-07-31 ENCOUNTER — Other Ambulatory Visit: Payer: Self-pay | Admitting: Emergency Medicine

## 2012-08-01 ENCOUNTER — Telehealth: Payer: Self-pay | Admitting: Emergency Medicine

## 2012-08-01 MED ORDER — PREDNISONE 5 MG PO TABS: 2.5000 mg | ORAL_TABLET | Freq: Every day | ORAL | Status: DC

## 2012-08-01 NOTE — Telephone Encounter (Signed)
Clarified with pt that Prednisone dose is 5mg  tablet 1/2 tablet daily.  Informed pt that rx was sent to pharmacy.

## 2012-08-26 ENCOUNTER — Encounter: Payer: Self-pay | Admitting: Emergency Medicine

## 2012-08-26 ENCOUNTER — Ambulatory Visit (INDEPENDENT_AMBULATORY_CARE_PROVIDER_SITE_OTHER): Payer: Medicare Other | Admitting: Emergency Medicine

## 2012-08-26 VITALS — BP 140/80 | HR 85 | Temp 97.0°F | Ht 69.0 in | Wt 189.0 lb

## 2012-08-26 DIAGNOSIS — J8409 Other alveolar and parieto-alveolar conditions: Secondary | ICD-10-CM

## 2012-08-26 DIAGNOSIS — J4489 Other specified chronic obstructive pulmonary disease: Secondary | ICD-10-CM

## 2012-08-26 DIAGNOSIS — J449 Chronic obstructive pulmonary disease, unspecified: Secondary | ICD-10-CM

## 2012-08-26 DIAGNOSIS — R0609 Other forms of dyspnea: Secondary | ICD-10-CM

## 2012-08-26 DIAGNOSIS — R0989 Other specified symptoms and signs involving the circulatory and respiratory systems: Secondary | ICD-10-CM

## 2012-08-26 DIAGNOSIS — R0600 Dyspnea, unspecified: Secondary | ICD-10-CM | POA: Insufficient documentation

## 2012-08-26 MED ORDER — TIOTROPIUM BROMIDE MONOHYDRATE 18 MCG IN CAPS: 18.0000 ug | ORAL_CAPSULE | Freq: Every day | RESPIRATORY_TRACT | Status: DC

## 2012-08-26 MED ORDER — PREDNISONE 10 MG PO TABS: 30.0000 mg | ORAL_TABLET | Freq: Every day | ORAL | Status: DC

## 2012-08-26 NOTE — Assessment & Plan Note (Signed)
Unclear severity, hasn't had repeat PFT yet - empiric trial adding spiriva qd - contineu foradil - encouraged O2 use - he hasn;t been using

## 2012-08-26 NOTE — Addendum Note (Signed)
Addended by: Orma Flaming D on: 08/26/2012 02:55 PM   Modules accepted: Orders

## 2012-08-26 NOTE — Assessment & Plan Note (Signed)
Hx COP, but his current CT scan is very unimpressive. Not clear to me that this is cause of his new dyspnea.  - trial pred burst to see if he responds >> pred 30mg  daily x 3 weeks.  - rov 1 month

## 2012-08-26 NOTE — Assessment & Plan Note (Signed)
Etiology unclear, but it is associated with hypoxemia. He isn't wearing O2 reliably. PFT haven't been done yet but onset seems to be fairly acute.  - trial treating COPD and COP as discussed - O2 - need to have cardiology eval to r/o ischemia.  - rov 1 month

## 2012-08-26 NOTE — Patient Instructions (Addendum)
Trial Spiriva daily until next visit Continue the foradil twice a day You need to wear your oxygen with all exertion set on 2L/min We will increase your prednisone to 30mg  daily for the next 3 weeks, then decrease back to 2.5mg   We will get your PFT at your next visit Follow with Dr Delton Coombes in 1 month with full PFT

## 2012-08-26 NOTE — Progress Notes (Signed)
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 Formerly on Xolair (stopped 6/09).   ROV 10/27/11 -- Hx tobacco/COPD +/- asthma, also COP that has been steroid dependent (pred 2.5mg  qd).  CXR today without any significant infiltrates. On foradil, combivent pr. Also note on lisinopril. Followd by Dr Ludwig Clarks. Had an episode of cough, mucous, dyspnea in February, treated with levaquin, combivent. Not clear whether this was PNA, ? Flare of COP.    ROV 04/24/12 -- Hx tobacco/COPD +/- asthma, also COP that has been steroid dependent (pred 2.5mg  qd). Last CT scan was in 2007. We talked about starting Spiriva, but he has had urinary retention in the past. He has allergies  - hasn't been on any therapy for this. He is on Combivent prn + foradil (only using qd). He believes that his breathing is stable.   ROV 07/18/12 -- Hx tobacco/COPD +/- asthma, also COP that has been steroid dependent (pred 2.5mg  qd). He has been on regimen of foradil (usually only qd) and combivent prn. Returns today reporting that since about 6 weeks ago he has had more exertional dyspnea. He occasionally wheezes, but this is at night. He gets some relief from combivent. Currently on foradil qd. Dr Ludwig Clarks following his progressive renal insufficiency. He is worried about possible heart disease - Dr Judie Petit did ECG that was reassuring per pt.   ROV 08/26/12 -- Hx tobacco/COPD +/- asthma, also COP that has been steroid dependent (pred 2.5mg  qd). He has been on regimen of foradil (usually only qd) and combivent prn. He has been more SOB. Last time we repeated CT chest >> some very subtle patchy LL GGI, not as severe as 2007. Also a pulm nodule 7mm and some apparent pleural thickening. PFT weren't done today due to the bad weather. Last time we also identified hypoxemia >> started him on 2L/min. He has an oximeter and has been in 70's with exertion on RA. He thinks he  may have been on spiriva before, but can't recall if it helped. He hasn't had a cardiac stress test since 2002. He isn't wearing O2.    Filed Vitals:   08/26/12 1355  BP: 140/80  Pulse: 85  Temp: 97 F (36.1 C)   Gen: Pleasant, well-nourished, in no distress,  normal affect  ENT: No lesions,  mouth clear,  oropharynx clear, no postnasal drip  Neck: No JVD, no TMG, no carotid bruits  Lungs: No use of accessory muscles, some soft basilar rhonchi B  Cardiovascular: RRR, heart sounds normal, no murmur or gallops, no peripheral edema  Musculoskeletal: No deformities, no cyanosis or clubbing  Neuro: alert, non focal  Skin: Warm, no lesions or rashes  CT chest 07/23/12 --  Comparison: 02/28/2006  Findings: Lungs/pleura: No pleural effusion. Calcified granuloma  identified in the left upper lobe. There is a noncalcified nodule  in the right upper lobe which measures 7 mm, image 29. New from  previous exam. A small nodule in the right lower lobe measures 5  mm, image 37. Also new from previous exam. Mild to moderate changes  of centrilobular emphysema. Nonspecific patchy areas of ground-  glass attenuation are noted in both lower lobes.  Heart/Mediastinum: Calcified atherosclerotic disease affects the  thoracic aorta and great vessels. There are calcifications within  the LAD, left circumflex and RCA coronary arteries. Mild cardiac  enlargement. No pericardial effusion. No enlarged mediastinal  lymph nodes identified.  Upper abdomen: Limited  imaging through the upper abdomen shows  bilateral renal cysts of varying intensities. Indeterminant,  exophytic lesion arising from the upper pole of the left kidney  measures 2 cm, image 58. Previously this measured 1 cm.  Bones/Musculoskeletal: There are no enlarged axillary or  supraclavicular lymph nodes. Multilevel spondylosis is visualized  within the thoracic spine. No worrisome lytic or sclerotic bone  lesions identified.   IMPRESSION:  1. Emphysema.  2. Patchy areas of ground-glass attenuation within the lung bases  consistent with nonspecific alveolitis.  3. Noncalcified nodule within the right lower lobe and right upper  lobe measuring up to 7 mm. If the patient is at high risk for  bronchogenic carcinoma, follow-up chest CT at 3-6 months is  recommended.    COPD (chronic obstructive pulmonary disease) Unclear severity, hasn't had repeat PFT yet - empiric trial adding spiriva qd - contineu foradil - encouraged O2 use - he hasn;t been using  [facility] ALVEOL&PARIETOALVEOL PNEUMONOPATHIES Hx COP, but his current CT scan is very unimpressive. Not clear to me that this is cause of his new dyspnea.  - trial pred burst to see if he responds >> pred 30mg  daily x 3 weeks.  - rov 1 month  Dyspnea Etiology unclear, but it is associated with hypoxemia. He isn't wearing O2 reliably. PFT haven't been done yet but onset seems to be fairly acute.  - trial treating COPD and COP as discussed - O2 - need to have cardiology eval to r/o ischemia.  - rov 1 month

## 2012-09-30 ENCOUNTER — Ambulatory Visit (INDEPENDENT_AMBULATORY_CARE_PROVIDER_SITE_OTHER): Payer: Medicare Other | Admitting: Emergency Medicine

## 2012-09-30 ENCOUNTER — Encounter: Payer: Self-pay | Admitting: Emergency Medicine

## 2012-09-30 VITALS — BP 140/80 | HR 101 | Temp 97.8°F | Ht 69.0 in | Wt 193.0 lb

## 2012-09-30 DIAGNOSIS — R0609 Other forms of dyspnea: Secondary | ICD-10-CM

## 2012-09-30 DIAGNOSIS — J449 Chronic obstructive pulmonary disease, unspecified: Secondary | ICD-10-CM

## 2012-09-30 DIAGNOSIS — R0600 Dyspnea, unspecified: Secondary | ICD-10-CM

## 2012-09-30 DIAGNOSIS — J8409 Other alveolar and parieto-alveolar conditions: Secondary | ICD-10-CM

## 2012-09-30 DIAGNOSIS — J45909 Unspecified asthma, uncomplicated: Secondary | ICD-10-CM

## 2012-09-30 LAB — PULMONARY FUNCTION TEST

## 2012-09-30 MED ORDER — TIOTROPIUM BROMIDE MONOHYDRATE 18 MCG IN CAPS: 18.0000 ug | ORAL_CAPSULE | Freq: Every day | RESPIRATORY_TRACT | Status: DC

## 2012-09-30 NOTE — Assessment & Plan Note (Signed)
Continue spiriva + foradil Wear O2 with all exertion - discussed w him today as his compliance has been poor.  Follow with Dr Delton Coombes in 3 months or sooner if you have any problems.

## 2012-09-30 NOTE — Assessment & Plan Note (Addendum)
radiographically stable by CT scan 2/'14 Continue pred 2.5mg  qd

## 2012-09-30 NOTE — Progress Notes (Signed)
PFT done today. 

## 2012-09-30 NOTE — Progress Notes (Signed)
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 Formerly on Xolair (stopped 6/09).   ROV 10/27/11 -- Hx tobacco/COPD +/- asthma, also COP that has been steroid dependent (pred 2.5mg  qd).  CXR today without any significant infiltrates. On foradil, combivent pr. Also note on lisinopril. Followd by Dr Ludwig Clarks. Had an episode of cough, mucous, dyspnea in February, treated with levaquin, combivent. Not clear whether this was PNA, ? Flare of COP.    ROV 04/24/12 -- Hx tobacco/COPD +/- asthma, also COP that has been steroid dependent (pred 2.5mg  qd). Last CT scan was in 2007. We talked about starting Spiriva, but he has had urinary retention in the past. He has allergies  - hasn't been on any therapy for this. He is on Combivent prn + foradil (only using qd). He believes that his breathing is stable.   ROV 07/18/12 -- Hx tobacco/COPD +/- asthma, also COP that has been steroid dependent (pred 2.5mg  qd). He has been on regimen of foradil (usually only qd) and combivent prn. Returns today reporting that since about 6 weeks ago he has had more exertional dyspnea. He occasionally wheezes, but this is at night. He gets some relief from combivent. Currently on foradil qd. Dr Ludwig Clarks following his progressive renal insufficiency. He is worried about possible heart disease - Dr Judie Petit did ECG that was reassuring per pt.   ROV 08/26/12 -- Hx tobacco/COPD +/- asthma, also COP that has been steroid dependent (pred 2.5mg  qd). He has been on regimen of foradil (usually only qd) and combivent prn. He has been more SOB. Last time we repeated CT chest >> some very subtle patchy LL GGI, not as severe as 2007. Also a pulm nodule 7mm and some apparent pleural thickening. PFT weren't done today due to the bad weather. Last time we also identified hypoxemia >> started him on 2L/min. He has an oximeter and has been in 70's with exertion on RA. He thinks he  may have been on spiriva before, but can't recall if it helped. He hasn't had a cardiac stress test since 2002. He isn't wearing O2.   ROV 09/30/12 -- Hx tobacco/COPD +/- asthma, also COP that has been steroid dependent (pred 2.5mg  qd). He has had more SOB, prompting repeat Ct scan 2/'14. Last time added spiriva to foradil, gave a trial of pred 30mg . Also encouraged reliable O2 usage. PFT 4/21 >> moderate AFL, no BD response, normal volumes, decreased DLCO. He believes that the spiriva has been helpful. His mucous production and cough are better. He is also less SOB with exertion. He underwent nuclear stress beginning of April >> reassuring test per pt.   PULMONARY FUNCTON TEST 09/30/2012  FVC 3.46  FEV1 1.91  FEV1/FVC 55.2  FVC  % Predicted 87  FEV % Predicted 76  FeF 25-75 .68  FeF 25-75 % Predicted 2.26    Filed Vitals:   09/30/12 1611  BP: 140/80  Pulse: 101  Temp: 97.8 F (36.6 C)  TempSrc: Oral  Height: 5\' 9"  (1.753 m)  Weight: 193 lb (87.544 kg)  SpO2: 95%   Gen: Pleasant, well-nourished, in no distress,  normal affect  ENT: No lesions,  mouth clear,  oropharynx clear, no postnasal drip  Neck: No JVD, no TMG, no carotid bruits  Lungs: No use of accessory muscles, some soft basilar rhonchi B  Cardiovascular: RRR, heart sounds normal, no murmur or gallops, no peripheral edema  Musculoskeletal: No deformities,  no cyanosis or clubbing  Neuro: alert, non focal  Skin: Warm, no lesions or rashes  CT chest 07/23/12 --  Comparison: 02/28/2006  Findings: Lungs/pleura: No pleural effusion. Calcified granuloma  identified in the left upper lobe. There is a noncalcified nodule  in the right upper lobe which measures 7 mm, image 29. New from  previous exam. A small nodule in the right lower lobe measures 5  mm, image 37. Also new from previous exam. Mild to moderate changes  of centrilobular emphysema. Nonspecific patchy areas of ground-  glass attenuation are noted in both  lower lobes.  Heart/Mediastinum: Calcified atherosclerotic disease affects the  thoracic aorta and great vessels. There are calcifications within  the LAD, left circumflex and RCA coronary arteries. Mild cardiac  enlargement. No pericardial effusion. No enlarged mediastinal  lymph nodes identified.  Upper abdomen: Limited imaging through the upper abdomen shows  bilateral renal cysts of varying intensities. Indeterminant,  exophytic lesion arising from the upper pole of the left kidney  measures 2 cm, image 58. Previously this measured 1 cm.  Bones/Musculoskeletal: There are no enlarged axillary or  supraclavicular lymph nodes. Multilevel spondylosis is visualized  within the thoracic spine. No worrisome lytic or sclerotic bone  lesions identified.  IMPRESSION:  1. Emphysema.  2. Patchy areas of ground-glass attenuation within the lung bases  consistent with nonspecific alveolitis.  3. Noncalcified nodule within the right lower lobe and right upper  lobe measuring up to 7 mm. If the patient is at high risk for  bronchogenic carcinoma, follow-up chest CT at 3-6 months is  recommended.    COPD (chronic obstructive pulmonary disease) Continue spiriva + foradil Wear O2 with all exertion - discussed w him today as his compliance has been poor.  Follow with Dr Delton Coombes in 3 months or sooner if you have any problems.   OTH SPEC ALVEOL&PARIETOALVEOL PNEUMONOPATHIES radiographically stable by CT scan 2/'14 Continue pred 2.5mg  qd

## 2012-09-30 NOTE — Patient Instructions (Addendum)
Please continue your Spiriva + Foradil as you are using them Continue your prednisone 2.5mg  daily.  Wear your your oxygen with ALL exertion.  Follow with Dr Delton Coombes in 3 months or sooner if you have any problems.

## 2012-10-01 ENCOUNTER — Telehealth: Payer: Self-pay | Admitting: Emergency Medicine

## 2012-10-01 DIAGNOSIS — J449 Chronic obstructive pulmonary disease, unspecified: Secondary | ICD-10-CM

## 2012-10-01 MED ORDER — TIOTROPIUM BROMIDE MONOHYDRATE 18 MCG IN CAPS: 18.0000 ug | ORAL_CAPSULE | Freq: Every day | RESPIRATORY_TRACT | Status: DC

## 2012-10-01 NOTE — Telephone Encounter (Signed)
Returning call can be reached at [phone].Albert Hansen

## 2012-10-01 NOTE — Telephone Encounter (Signed)
Spoke with patient, patient requesting Spiriva Rx sent to [facility] @ [phone]. Rx has been printed out and will be faxed in AM as RB is out of office until then. Patient aware and nothing further needed at this time.

## 2012-10-01 NOTE — Telephone Encounter (Signed)
ATC patient, no answer LMOMTCB 

## 2012-10-21 ENCOUNTER — Encounter: Payer: Self-pay | Admitting: Emergency Medicine

## 2012-12-31 ENCOUNTER — Encounter: Payer: Self-pay | Admitting: Emergency Medicine

## 2012-12-31 ENCOUNTER — Ambulatory Visit (INDEPENDENT_AMBULATORY_CARE_PROVIDER_SITE_OTHER): Payer: Medicare Other | Admitting: Emergency Medicine

## 2012-12-31 VITALS — BP 116/84 | HR 84 | Temp 98.2°F | Ht 70.0 in | Wt 192.6 lb

## 2012-12-31 DIAGNOSIS — J449 Chronic obstructive pulmonary disease, unspecified: Secondary | ICD-10-CM

## 2012-12-31 DIAGNOSIS — J8409 Other alveolar and parieto-alveolar conditions: Secondary | ICD-10-CM

## 2012-12-31 NOTE — Progress Notes (Signed)
  Subjective:    Patient ID: Albert Hansen, male    DOB: [birth year], 79 y.o.   MRN: [id number]  HPI FH with history of COPD C is here for a follow up vist. He states his sx are get getting progressively worse since last visit. He has a cough 1-2x a day with yellow phlegm in chest in the morning. He is limited in activities and feels fatigued throughout the day. He is currently on 3L 24hrs. He sleeps relatively well without nightime flares, however the trazadone he is on gives him nightmares. He denies chest tightness, chest pain, and leg swelling.  His current medications are Spiriva, Foradil, Albupterol, and Combivent respimat. He states the Spiriva is hard to breath in because of the level of breath he has to use to take it in. He is requesting a different form of Spiriva such as an inhaler or nebulized treatment.   How often using the combivent? Uses daily History of flares? Not since last time.    Review of Systems Denies cp,  Has leg swelling Ears throat clear Lungs with diminished breath sounds b/l   Objective:   Physical Exam PE  ENT - eyes are clear, sinuses are non-tender. Nose appears dry and raw. Oropharynx clear.  Lungs - diminshed breath sounds with rales?     Assessment & Plan:  OTH SPEC ALVEOL&PARIETOALVEOL PNEUMONOPATHIES pred 2.5  COPD (chronic obstructive pulmonary disease) Will trial changing spiriva/foradil to Anoro. If easier to take will contineu this. If not then will probably need to change to nebs rov 3

## 2012-12-31 NOTE — Assessment & Plan Note (Signed)
pred 2.5

## 2012-12-31 NOTE — Patient Instructions (Addendum)
Please stop spiriva and foradil temporarily Start Anoro 1 inhalation daily Call our office if you worsen with the change in medications.  Wear O2 at 3L/min at all times Follow with Dr Delton Coombes in 3 months or sooner if you have any problems.

## 2012-12-31 NOTE — Assessment & Plan Note (Signed)
Will trial changing spiriva/foradil to Anoro. If easier to take will contineu this. If not then will probably need to change to nebs rov 3

## 2013-03-26 ENCOUNTER — Telehealth: Payer: Self-pay | Admitting: Emergency Medicine

## 2013-03-26 DIAGNOSIS — J449 Chronic obstructive pulmonary disease, unspecified: Secondary | ICD-10-CM

## 2013-03-26 NOTE — Telephone Encounter (Signed)
I spoke with the pt and he is requesting that an order be placed for a battery operated portable system that he can plug into his car while driving to charge. Pt travels a lot and needs something more portable then the tanks he has. Order placed. Carron Curie, CMA

## 2013-03-26 NOTE — Telephone Encounter (Signed)
lmomtcb x1 for pt 

## 2013-03-31 ENCOUNTER — Telehealth: Payer: Self-pay | Admitting: Emergency Medicine

## 2013-03-31 NOTE — Telephone Encounter (Signed)
Order printed off and refaxed to [facility]

## 2013-03-31 NOTE — Telephone Encounter (Signed)
I spoke with [facility]. I was advised they have not received any order on this pt. She asked if this can be refaxed to them at [phone] please advise PCC's thanks

## 2013-04-15 ENCOUNTER — Encounter: Payer: Self-pay | Admitting: Emergency Medicine

## 2013-04-15 ENCOUNTER — Ambulatory Visit (INDEPENDENT_AMBULATORY_CARE_PROVIDER_SITE_OTHER): Payer: Medicare Other | Admitting: Emergency Medicine

## 2013-04-15 VITALS — BP 140/90 | HR 91 | Ht 69.5 in | Wt 192.0 lb

## 2013-04-15 DIAGNOSIS — J449 Chronic obstructive pulmonary disease, unspecified: Secondary | ICD-10-CM

## 2013-04-15 NOTE — Assessment & Plan Note (Signed)
Discussed a possible switch from Spiriva and Foradil to Anoro. The feasibility of this will depend on whether he can obtain the medication from the [place]. He will check to see if they can provide the Anoro.  Work on Therapist, art.  rov 3 pred 2.5

## 2013-04-15 NOTE — Patient Instructions (Signed)
Please continue Spiriva and Foradil. Use Combivent as needed. Please also speak to your physician at the [facility] regarding a possible switch from these medications to Anoro once a day. If covered by the [facility] then you might benefit from the switch.  We will work on getting you a portable oxygen concentrator Follow with Dr Delton Coombes in 3 months or sooner if you have any problems.

## 2013-04-15 NOTE — Progress Notes (Signed)
Subjective:    Patient ID: Albert Hansen, male    DOB: 06/05/1930, 79 y.o.   MRN: [id number]  HPI ROV 09/30/12 -- Hx tobacco/COPD +/- asthma, also COP that has been steroid dependent (pred 2.5mg  qd). He has had more SOB, prompting repeat Ct scan 2/'14. Last time added spiriva to foradil, gave a trial of pred 30mg . Also encouraged reliable O2 usage. PFT 4/21 >> moderate AFL, no BD response, normal volumes, decreased DLCO. He believes that the spiriva has been helpful. His mucous production and cough are better. He is also less SOB with exertion. He underwent nuclear stress beginning of April >> reassuring test per pt.  ROV 12/31/12 -- FH with history of COPD C is here for a follow up vist. He states his sx are get getting progressively worse since last visit. He has a cough 1-2x a day with yellow phlegm in chest in the morning. He is limited in activities and feels fatigued throughout the day. He is currently on 3L 24hrs. He sleeps relatively well without nightime flares, however the trazadone he is on gives him nightmares. He denies chest tightness, chest pain, and leg swelling.  His current medications are Spiriva, Foradil, Albupterol, and Combivent respimat. He states the Spiriva is hard to breath in because of the level of breath he has to use to take it in. He is requesting a different form of Spiriva such as an inhaler or nebulized treatment.   ROV 04/15/13 -- Hx tobacco/COPD +/- asthma, also COP that has been steroid dependent (pred 2.5mg  qd). Returns for f/u/. Needs a portable concentrator, planning to travel. Goal will be to get it before he travels. His breathing has slowly worsened overtime. He tried anoro x 3-4 weeks but he is not sure it helped. He has some R flank pain. Needs repeat CT scan in February 2015.    Review of Systems As per HPI   Objective:   Physical Exam Filed Vitals:   04/15/13 1428  BP: 140/90  Pulse: 91  Height: 5' 9.5" (1.765 m)  Weight: 192 lb (87.091 kg)  SpO2:  93%   Gen: Pleasant, well-nourished, in no distress, normal affect  ENT: No lesions, mouth clear, oropharynx clear, no postnasal drip  Neck: No JVD, no TMG, no carotid bruits  Lungs: No use of accessory muscles, some soft basilar rhonchi B  Cardiovascular: RRR, heart sounds normal, no murmur or gallops, no peripheral edema  Musculoskeletal: No deformities, no cyanosis or clubbing  Neuro: alert, non focal  Skin: Warm, no lesions or rashes   07/23/12 --  Comparison: 02/28/2006  Findings: Lungs/pleura: No pleural effusion. Calcified granuloma  identified in the left upper lobe. There is a noncalcified nodule  in the right upper lobe which measures 7 mm, image 29. New from  previous exam. A small nodule in the right lower lobe measures 5  mm, image 37. Also new from previous exam. Mild to moderate changes  of centrilobular emphysema. Nonspecific patchy areas of ground-  glass attenuation are noted in both lower lobes.  Heart/Mediastinum: Calcified atherosclerotic disease affects the  thoracic aorta and great vessels. There are calcifications within  the LAD, left circumflex and RCA coronary arteries. Mild cardiac  enlargement. No pericardial effusion. No enlarged mediastinal  lymph nodes identified.  Upper abdomen: Limited imaging through the upper abdomen shows  bilateral renal cysts of varying intensities. Indeterminant,  exophytic lesion arising from the upper pole of the left kidney  measures 2 cm, image 58. Previously this measured  1 cm.  Bones/Musculoskeletal: There are no enlarged axillary or  supraclavicular lymph nodes. Multilevel spondylosis is visualized  within the thoracic spine. No worrisome lytic or sclerotic bone  lesions identified.  IMPRESSION:  1. Emphysema.  2. Patchy areas of ground-glass attenuation within the lung bases  consistent with nonspecific alveolitis.  3. Noncalcified nodule within the right lower lobe and right upper  lobe measuring up to 7 mm. If  the patient is at high risk for  bronchogenic carcinoma, follow-up chest CT at 3-6 months is  recommended. If the patient is at low risk for bronchogenic  carcinoma, follow-up chest CT at 6-12 months is recommended. This  recommendation follows the consensus statement: Guidelines for  Management of Small Pulmonary Nodules Detected on CT Scans: A  Statement from the Fleischner Society as published in Radiology  2005; 237:395-400.  4. There is atherosclerosis of the thoracic aorta, the great  vessels of the mediastinum and the coronary arteries, including  calcified atherosclerotic plaque in the LAD, left circumflex and  RCA coronary arteries.  Atherosclerosis, including three-vessel coronary artery disease.  Please note that although the presence of coronary artery calcium  documents the presence of coronary artery disease, the severity of  this disease and any potential stenosis cannot be assessed on this  non-gated CT examination. Assessment for potential risk factor  modification, dietary therapy or pharmacologic therapy may be  warranted, if clinically indicated.  5. Indeterminant exophytic lesion arising from the upper pole of  the left kidney. Small renal cell carcinoma cannot be excluded.  Advise further evaluation with contrast enhanced MRI of the  kidneys.      Assessment & Plan:  COPD (chronic obstructive pulmonary disease) Discussed a possible switch from Spiriva and Foradil to Anoro. The feasibility of this will depend on whether he can obtain the medication from the [place]. He will check to see if they can provide the Anoro.  Work on Therapist, art.  rov 3 pred 2.5

## 2013-04-16 ENCOUNTER — Telehealth: Payer: Self-pay | Admitting: Emergency Medicine

## 2013-04-16 NOTE — Telephone Encounter (Signed)
Spoke with Albert Hansen ordering concentrator she stated will call [name] and explain order is in and will be delivered shortly. Nothing further needed

## 2013-04-16 NOTE — Telephone Encounter (Signed)
Spoke with the pt  He states that he is returning call to [facility] regarding o2  Meghan, please advise or call the pt back, thanks

## 2013-04-29 ENCOUNTER — Telehealth: Payer: Self-pay | Admitting: Emergency Medicine

## 2013-04-29 NOTE — Telephone Encounter (Signed)
   Called spoke with patient who reported that he fell in the exam room at the 11.4.14 ov w/ RB >> tripped over his O2 tubing and fell into the exam table  [place] felt sure that he would not have any discomfort but developed some dyspnea and has been unable to to take his inhalers since this episode because he cannot take a deep breath in and is supplementing this with using more O2.  RB in hosp this week  MW with opening tomorrow afternoon at 2pm  Patient requesting cxr prior to appt - note placed in appt notes but would like to MW to be aware of situation before placing order  Will speak with Verlon Au and sign off

## 2013-04-30 ENCOUNTER — Ambulatory Visit (INDEPENDENT_AMBULATORY_CARE_PROVIDER_SITE_OTHER)
Admission: RE | Admit: 2013-04-30 | Discharge: 2013-04-30 | Disposition: A | Discharge status: Home / Self Care | Payer: Medicare Other | Source: Ambulatory Visit | Attending: Internal Medicine | Admitting: Internal Medicine

## 2013-04-30 ENCOUNTER — Encounter: Payer: Self-pay | Admitting: Internal Medicine

## 2013-04-30 ENCOUNTER — Ambulatory Visit (INDEPENDENT_AMBULATORY_CARE_PROVIDER_SITE_OTHER): Payer: Medicare Other | Admitting: Internal Medicine

## 2013-04-30 ENCOUNTER — Other Ambulatory Visit: Payer: Self-pay | Admitting: Emergency Medicine

## 2013-04-30 VITALS — BP 160/80 | HR 81 | Temp 97.8°F | Ht 69.5 in | Wt 191.0 lb

## 2013-04-30 DIAGNOSIS — J8409 Other alveolar and parieto-alveolar conditions: Secondary | ICD-10-CM

## 2013-04-30 DIAGNOSIS — J449 Chronic obstructive pulmonary disease, unspecified: Secondary | ICD-10-CM

## 2013-04-30 DIAGNOSIS — I10 Essential (primary) hypertension: Secondary | ICD-10-CM

## 2013-04-30 DIAGNOSIS — J9610 Chronic respiratory failure, unspecified whether with hypoxia or hypercapnia: Secondary | ICD-10-CM

## 2013-04-30 MED ORDER — ARFORMOTEROL TARTRATE 15 MCG/2ML IN NEBU: 15.0000 ug | INHALATION_SOLUTION | Freq: Two times a day (BID) | RESPIRATORY_TRACT | Status: DC

## 2013-04-30 MED ORDER — HYDROCODONE-ACETAMINOPHEN 5-325 MG PO TABS: 1.0000 | ORAL_TABLET | Freq: Four times a day (QID) | ORAL | Status: DC | PRN

## 2013-04-30 MED ORDER — OLMESARTAN MEDOXOMIL 40 MG PO TABS: 40.0000 mg | ORAL_TABLET | Freq: Every day | ORAL | Status: DC

## 2013-04-30 NOTE — Progress Notes (Signed)
Subjective:    Patient ID: Albert Hansen, male    DOB: [birth year]     MRN: [id number]  HPI ROV 09/30/12 -- Hx tobacco/COPD +/- asthma, also COP that has been steroid dependent (pred 2.5mg  qd). He has had more SOB, prompting repeat Ct scan 2/'14. Last time added spiriva to foradil, gave a trial of pred 30mg . Also encouraged reliable O2 usage. PFT 4/21 >> moderate AFL, no BD response, normal volumes, decreased DLCO. He believes that the spiriva has been helpful. His mucous production and cough are better. He is also less SOB with exertion. He underwent nuclear stress beginning of April >> reassuring test per pt.  ROV 12/31/12 -- FH with history of COPD C is here for a follow up vist. He states his sx are get getting progressively worse since last visit. He has a cough 1-2x a day with yellow phlegm in chest in the morning. He is limited in activities and feels fatigued throughout the day. He is currently on 3L 24hrs. He sleeps relatively well without nightime flares, however the trazadone he is on gives him nightmares. He denies chest tightness, chest pain, and leg swelling.  His current medications are Spiriva, Foradil, Albupterol, and Combivent respimat. He states the Spiriva is hard to breath in because of the level of breath he has to use to take it in. He is requesting a different form of Spiriva such as an inhaler or nebulized treatment.   ROV 04/15/13 -- Hx tobacco/COPD +/- asthma, also COP that has been steroid dependent (pred 2.5mg  qd). Returns for f/u/. Needs a portable concentrator, planning to travel. Goal will be to get it before he travels. His breathing has slowly worsened overtime. He tried anoro x 3-4 weeks but he is not sure it helped. He has some R flank pain. Needs repeat CT scan in February 2015.  rec Please continue Spiriva and Foradil. Use Combivent as needed. Please also speak to your physician at the [facility] regarding a possible switch from these medications to Anoro once a day. If covered by  the [facility] then you might benefit from the switch.  We will work on getting you a portable oxygen concentrator   04/30/2013 f/u ov/Albert Hansen re: L CP p fell  Chief Complaint  Patient presents with  . Acute Visit    Pt states fell into exam table while here on 04/15/13- next day developed left side rib pain-worse upon inspiration. He states having increased SOB and has not been able to use any inhaled meds for the past 2 wks.   breathing worse since fell, no hemoptysis, hurts to cough or deep breath.  No obvious day to day or daytime variabilty or assoc   chest tightness, subjective wheeze overt sinus or hb symptoms. No unusual exp hx or h/o childhood pna/ asthma or knowledge of premature birth.  Sleeping ok without nocturnal  or early am exacerbation  of respiratory  c/o's or need for noct saba. Also denies any obvious fluctuation of symptoms with weather or environmental changes or other aggravating or alleviating factors except as outlined above   Current Medications, Allergies, Complete Past Medical History, Past Surgical History, Family History, and Social History were reviewed in [employer] record.  ROS  The following are not active complaints unless bolded sore throat, dysphagia, dental problems, itching, sneezing,  nasal congestion or excess/ purulent secretions, ear ache,   fever, chills, sweats, unintended wt loss,   exertional cp, hemoptysis,  orthopnea pnd or leg swelling, presyncope,  palpitations, heartburn, abdominal pain, anorexia, nausea, vomiting, diarrhea  or change in bowel or urinary habits, change in stools or urine, dysuria,hematuria,  rash, arthralgias, visual complaints, headache, numbness weakness or ataxia or problems with walking or coordination,  change in mood/affect or memory.             Objective:   Physical Exam  Wt Readings from Last 3 Encounters:  04/30/13 191 lb (86.637 kg)  04/15/13 192 lb (87.091 kg)  12/31/12 192 lb 9.6 oz (87.363  kg)        Gen: chronically ill obese wm nad able to get on to table s assistance from chair  ENT: No lesions, mouth clear, oropharynx clear, no postnasal drip  Neck: No JVD, no TMG, no carotid bruits  Lungs: No use of accessory muscles, slt reduced bs, no wheeze Cardiovascular: RRR, heart sounds normal, no murmur or gallops, no peripheral edema  Musculoskeletal: No deformities, no cyanosis or clubbing  Neuro: alert, non focal  Skin: Warm, no lesions or rashes- very subtle bruise x  4 x 2 cm below L costochrondral margin anteriorly, no palp splenomegaly       CXR  04/30/2013 :  No active cardiopulmonary disease.     Assessment & Plan:

## 2013-04-30 NOTE — Patient Instructions (Addendum)
Continue to adjust your 02 to maintain above 90%  For the next two weeks: Stop lisinopril Start Benicar 40 mg one daily  Change nexium 40 mg Take 30-60 min before first meal of the day  Stop your powder inhalers and use brovana neb twice daily automatically and combivent every 4-6 hours as needed   For pain use vicodin 5 mg every 4 hours as needed   Please schedule a follow up office visit in 2 weeks, sooner if needed to see Bascom Levels NP, or Wert

## 2013-05-03 DIAGNOSIS — J9610 Chronic respiratory failure, unspecified whether with hypoxia or hypercapnia: Secondary | ICD-10-CM | POA: Insufficient documentation

## 2013-05-03 DIAGNOSIS — I10 Essential (primary) hypertension: Secondary | ICD-10-CM | POA: Insufficient documentation

## 2013-05-03 NOTE — Assessment & Plan Note (Addendum)
09/30/12 PFTs  FEV1  1.91 (76%) ratio 55 with DLCO 53%   Symptoms are markedly disproportionate to objective findings (FEV1 of 1.96 does not translate  and not clear this is a lung problem but pt does appear to have difficult airway management issues.   DDX of  difficult airways managment all start with A and  include Adherence, Ace Inhibitors, Acid Reflux, Active Sinus Disease, Alpha 1 Antitripsin deficiency, Anxiety masquerading as Airways dz,  ABPA,  allergy(esp in young), Aspiration (esp in elderly), Adverse effects of DPI,  Active smokers, plus two Bs  = Bronchiectasis and Beta blocker use..and one C= CHF  Adherence is always the initial "prime suspect" and is a multilayered concern that requires a "trust but verify" approach in every patient - starting with knowing how to use medications, especially inhalers, correctly, keeping up with refills and understanding the fundamental difference between maintenance and prns vs those medications only taken for a very short course and then stopped and not refilled.   ?acei effect > despite absence of typical cough the only way to know for sure if it's the acei contributing is to stop it  ? Acid (or non-acid) GERD > always difficult to exclude as up to 75% of pts in some series report no assoc GI/ Heartburn symptoms> rec max (24h)  acid suppression and diet restrictions/ reviewed and instructions given in witting  ? Adverse effect of dpi/ try brovan bid  and combivent prn  only x 2 weeks then regroup re maint rx needs   ? Anxiety > always a dx of exclusion

## 2013-05-03 NOTE — Assessment & Plan Note (Signed)
ACE inhibitors are problematic in  pts with airway complaints because  even experienced pulmonologists can't always distinguish ace effects from copd/asthma.  By themselves they don't actually cause a problem, much like oxygen can't by itself start a fire, but they certainly serve as a powerful catalyst or enhancer for any "fire"  or inflammatory process in the upper airway, be it caused by an ET  tube or more commonly reflux (especially in the obese or pts with known GERD or who are on biphoshonates).    In the era of ARB near equivalency until we have a better handle on the reversibility of the airway problem, it just makes sense to avoid ACEI  entirely in the short run and then decide later, having established a level of airway control using a reasonable limited regimen, whether to add back ace but even then being very careful to observe the pt for worsening airway control and number of meds used/ needed to control symptoms.    For now try benicar 40 mg daily

## 2013-05-03 NOTE — Assessment & Plan Note (Signed)
07/18/12 Patient Saturations on Room Air at Rest = 94% Patient Saturations on [employer] while Ambulating = 85% Patient Saturations on 2 Liters of oxygen while Ambulating = 94%  Adequate control on present rx, reviewed > no change in rx needed

## 2013-05-03 NOTE — Assessment & Plan Note (Signed)
The goal with a chronic steroid dependent illness is always arriving at the lowest effective dose that controls the disease/symptoms and not accepting a set "formula" which is based on statistics or guidelines that don't always take into account patient  variability or the natural hx of the dz in every individual patient, which may well vary over time.  For now therefore I recommend the patient maintain  2.5 mg floor just to prevent adrenal insuff as there is no evidence of ongoing alveolitis clinically/ radiographically.

## 2013-05-14 ENCOUNTER — Ambulatory Visit: Payer: Medicare Other | Admitting: Internal Medicine

## 2013-07-15 ENCOUNTER — Ambulatory Visit (INDEPENDENT_AMBULATORY_CARE_PROVIDER_SITE_OTHER): Payer: Medicare Other | Admitting: Emergency Medicine

## 2013-07-15 ENCOUNTER — Telehealth: Payer: Self-pay | Admitting: Emergency Medicine

## 2013-07-15 ENCOUNTER — Encounter: Payer: Self-pay | Admitting: Emergency Medicine

## 2013-07-15 VITALS — BP 150/90 | HR 71 | Ht 69.0 in | Wt 191.8 lb

## 2013-07-15 DIAGNOSIS — J449 Chronic obstructive pulmonary disease, unspecified: Secondary | ICD-10-CM

## 2013-07-15 NOTE — Progress Notes (Signed)
Subjective:    Patient ID: Albert Hansen, male    DOB: 05-20-1930, 80 y.o.   MRN: [id number]  HPI ROV 09/30/12 -- Hx tobacco/COPD +/- asthma, also COP that has been steroid dependent (pred 2.5mg  qd). He has had more SOB, prompting repeat Ct scan 2/'14. Last time added spiriva to foradil, gave a trial of pred 30mg . Also encouraged reliable O2 usage. PFT 4/21 >> moderate AFL, no BD response, normal volumes, decreased DLCO. He believes that the spiriva has been helpful. His mucous production and cough are better. He is also less SOB with exertion. He underwent nuclear stress beginning of April >> reassuring test per pt.  ROV 12/31/12 -- FH with history of COPD C is here for a follow up vist. He states his sx are get getting progressively worse since last visit. He has a cough 1-2x a day with yellow phlegm in chest in the morning. He is limited in activities and feels fatigued throughout the day. He is currently on 3L 24hrs. He sleeps relatively well without nightime flares, however the trazadone he is on gives him nightmares. He denies chest tightness, chest pain, and leg swelling.  His current medications are Spiriva, Foradil, Albupterol, and Combivent respimat. He states the Spiriva is hard to breath in because of the level of breath he has to use to take it in. He is requesting a different form of Spiriva such as an inhaler or nebulized treatment.   ROV 04/15/13 -- Hx tobacco/COPD +/- asthma, also COP that has been steroid dependent (pred 2.5mg  qd). Returns for f/u/. Needs a portable concentrator, planning to travel. Goal will be to get it before he travels. His breathing has slowly worsened overtime. He tried anoro x 3-4 weeks but he is not sure it helped. He has some R flank pain. Needs repeat CT scan in February 2015.   ROV 07/15/13 -- Hx tobacco/COPD +/- asthma, also COP that has been steroid dependent (pred 2.5mg  qd). He followed w Dr Melvyn Novas in Nov to be evaluated after he fell here 04/15/13. His lisinopril  was changed to benicar x 2 months and now he is back on lisinopril, inhalers were to be changed to brovana bid + combivent prn. He is now on symbicort bid, spiriva qd, combivent prn from the [place]   Review of Systems As per HPI   Objective:   Physical Exam Filed Vitals:   07/15/13 1513  BP: 150/90  Pulse: 71  Height: 5\' 9"  (1.753 m)  Weight: 191 lb 12.8 oz (87 kg)  SpO2: 93%   Gen: Pleasant, well-nourished, in no distress, normal affect  ENT: No lesions, mouth clear, oropharynx clear, no postnasal drip  Neck: No JVD, no TMG, no carotid bruits  Lungs: No use of accessory muscles, some soft basilar rhonchi B  Cardiovascular: RRR, heart sounds normal, no murmur or gallops, no peripheral edema  Musculoskeletal: No deformities, no cyanosis or clubbing  Neuro: alert, non focal  Skin: Warm, no lesions or rashes   07/23/12 --  Comparison: 02/28/2006  Findings: Lungs/pleura: No pleural effusion. Calcified granuloma  identified in the left upper lobe. There is a noncalcified nodule  in the right upper lobe which measures 7 mm, image 29. New from  previous exam. A small nodule in the right lower lobe measures 5  mm, image 37. Also new from previous exam. Mild to moderate changes  of centrilobular emphysema. Nonspecific patchy areas of ground-  glass attenuation are noted in both lower lobes.  Heart/Mediastinum: Calcified atherosclerotic  disease affects the  thoracic aorta and great vessels. There are calcifications within  the LAD, left circumflex and RCA coronary arteries. Mild cardiac  enlargement. No pericardial effusion. No enlarged mediastinal  lymph nodes identified.  Upper abdomen: Limited imaging through the upper abdomen shows  bilateral renal cysts of varying intensities. Indeterminant,  exophytic lesion arising from the upper pole of the left kidney  measures 2 cm, image 58. Previously this measured 1 cm.  Bones/Musculoskeletal: There are no enlarged axillary or   supraclavicular lymph nodes. Multilevel spondylosis is visualized  within the thoracic spine. No worrisome lytic or sclerotic bone  lesions identified.  IMPRESSION:  1. Emphysema.  2. Patchy areas of ground-glass attenuation within the lung bases  consistent with nonspecific alveolitis.  3. Noncalcified nodule within the right lower lobe and right upper  lobe measuring up to 7 mm. If the patient is at high risk for  bronchogenic carcinoma, follow-up chest CT at 3-6 months is  recommended. If the patient is at low risk for bronchogenic  carcinoma, follow-up chest CT at 6-12 months is recommended. This  recommendation follows the consensus statement: Guidelines for  Management of Small Pulmonary Nodules Detected on CT Scans: A  Statement from the [place] as published in Radiology  2005; 237:395-400.  4. There is atherosclerosis of the thoracic aorta, the great  vessels of the mediastinum and the coronary arteries, including  calcified atherosclerotic plaque in the LAD, left circumflex and  RCA coronary arteries.  Atherosclerosis, including three-vessel coronary artery disease.  Please note that although the presence of coronary artery calcium  documents the presence of coronary artery disease, the severity of  this disease and any potential stenosis cannot be assessed on this  non-gated CT examination. Assessment for potential risk factor  modification, dietary therapy or pharmacologic therapy may be  warranted, if clinically indicated.  5. Indeterminant exophytic lesion arising from the upper pole of  the left kidney. Small renal cell carcinoma cannot be excluded.  Advise further evaluation with contrast enhanced MRI of the  kidneys.      Assessment & Plan:  COPD GOLD II Please continue your Spiriva daily  Continue your Symbicort 2 puffs twice a day Use combivent as needed Wear your oxygen at all times.  Continue your lisinopril as you have been taking it Follow  with Dr Lamonte Sakai in 2 months or sooner if you have any problems

## 2013-07-15 NOTE — Assessment & Plan Note (Signed)
Please continue your Spiriva daily  Continue your Symbicort 2 puffs twice a day Use combivent as needed Wear your oxygen at all times.  Continue your lisinopril as you have been taking it Follow with Dr Lamonte Sakai in 2 months or sooner if you have any problems

## 2013-07-15 NOTE — Patient Instructions (Signed)
Please continue your Spiriva daily  Continue your Symbicort 2 puffs twice a day Use combivent as needed Wear your oxygen at all times.  Continue your lisinopril as you have been taking it Follow with Dr Jessi Jessop in 2 months or sooner if you have any problems 

## 2013-07-16 NOTE — Telephone Encounter (Signed)
Thank you will make note in chart

## 2013-07-16 NOTE — Telephone Encounter (Signed)
FYI per pt. His lisinopril is 40mg  daily. He states Dr. Lamonte Sakai wanted him to check on this and let him know. I will forward as an FYI. Addy Bing, CMA

## 2013-08-05 ENCOUNTER — Other Ambulatory Visit: Payer: Self-pay | Admitting: Dermatology

## 2013-08-25 ENCOUNTER — Telehealth: Payer: Self-pay | Admitting: Emergency Medicine

## 2013-08-25 DIAGNOSIS — J449 Chronic obstructive pulmonary disease, unspecified: Secondary | ICD-10-CM

## 2013-08-25 NOTE — Telephone Encounter (Signed)
Spoke to pt He has increased o2 to 5L but satn still in 80s, improves ain am after he sleeps I called [facility] - forwarded to their answering service -needs a high flow cannula Asked him to seek evaluation ED if worse -he is at the [facility]

## 2013-08-25 NOTE — Telephone Encounter (Signed)
ATC line fast busy signal x 3 wcb

## 2013-08-26 NOTE — Telephone Encounter (Signed)
I called and spoke with pt. He reports [facility] advised him he needs an oxymizer for his O2. I advised pt will send this order for him. Nothing further needed

## 2013-09-01 ENCOUNTER — Telehealth: Payer: Self-pay | Admitting: Emergency Medicine

## 2013-09-01 NOTE — Telephone Encounter (Signed)
Pt reports while away had to turn O2 up to 5L due to sob and sats dropping. Took pred 20mg  that he had 40mg  x3 day, then 1x1 day and 1x1 day Since that breathing some better.  Is requesting a CT scan of chest to be order and follow up with RB. Please advise if this is ok to place order or what you would like done, thanks

## 2013-09-02 NOTE — Telephone Encounter (Signed)
Advised pt of RB's rec's and scheduled appointment.  Nothing further needed.

## 2013-09-02 NOTE — Telephone Encounter (Signed)
Set up an OV for him. Tell him I don't want a CT scan now. We can decide about this after I see him.

## 2013-09-16 ENCOUNTER — Other Ambulatory Visit: Payer: Self-pay | Admitting: Internal Medicine

## 2013-09-16 DIAGNOSIS — R0989 Other specified symptoms and signs involving the circulatory and respiratory systems: Secondary | ICD-10-CM

## 2013-09-16 DIAGNOSIS — L819 Disorder of pigmentation, unspecified: Secondary | ICD-10-CM

## 2013-09-22 ENCOUNTER — Ambulatory Visit
Admission: RE | Admit: 2013-09-22 | Discharge: 2013-09-22 | Disposition: A | Discharge status: Home / Self Care | Payer: Medicare Other | Source: Ambulatory Visit | Attending: Internal Medicine | Admitting: Internal Medicine

## 2013-09-22 DIAGNOSIS — R0989 Other specified symptoms and signs involving the circulatory and respiratory systems: Secondary | ICD-10-CM

## 2013-09-22 DIAGNOSIS — L819 Disorder of pigmentation, unspecified: Secondary | ICD-10-CM

## 2013-09-23 ENCOUNTER — Encounter: Payer: Self-pay | Admitting: Emergency Medicine

## 2013-09-23 ENCOUNTER — Ambulatory Visit (INDEPENDENT_AMBULATORY_CARE_PROVIDER_SITE_OTHER)
Admission: RE | Admit: 2013-09-23 | Discharge: 2013-09-23 | Disposition: A | Discharge status: Home / Self Care | Payer: Medicare Other | Source: Ambulatory Visit | Attending: Emergency Medicine | Admitting: Emergency Medicine

## 2013-09-23 ENCOUNTER — Ambulatory Visit (INDEPENDENT_AMBULATORY_CARE_PROVIDER_SITE_OTHER): Payer: Medicare Other | Admitting: Emergency Medicine

## 2013-09-23 VITALS — BP 140/86 | HR 78 | Ht 69.5 in | Wt 193.0 lb

## 2013-09-23 DIAGNOSIS — J449 Chronic obstructive pulmonary disease, unspecified: Secondary | ICD-10-CM

## 2013-09-23 MED ORDER — PREDNISONE 10 MG PO TABS: 10.0000 mg | ORAL_TABLET | Freq: Every day | ORAL | Status: DC

## 2013-09-23 NOTE — Patient Instructions (Signed)
We will increase your prednisone to 10mg  daily Continue your symbicort and spiriva We will ask [facility] to titrate your oxygen with oximyzer and nasal canula at rest and with walking  CXR today Follow with Dr Lamonte Sakai in 4-6 weeks

## 2013-09-23 NOTE — Assessment & Plan Note (Signed)
-   increase pred to 10mg  qd fo rnow - reconfirm appropriate flow rate fo ro2 - CXR  - continue symbicort and spiriva - rov 4-6 weeks

## 2013-09-23 NOTE — Progress Notes (Signed)
Subjective:    Patient ID: Albert Hansen, male    DOB: 05-19-1930, 80 y.o.   MRN: [id number]  HPI ROV 09/30/12 -- Hx tobacco/COPD +/- asthma, also COP that has been steroid dependent (pred 2.5mg  qd). He has had more SOB, prompting repeat Ct scan 2/'14. Last time added spiriva to foradil, gave a trial of pred 30mg . Also encouraged reliable O2 usage. PFT 4/21 >> moderate AFL, no BD response, normal volumes, decreased DLCO. He believes that the spiriva has been helpful. His mucous production and cough are better. He is also less SOB with exertion. He underwent nuclear stress beginning of April >> reassuring test per pt.  ROV 12/31/12 -- FH with history of COPD C is here for a follow up vist. He states his sx are get getting progressively worse since last visit. He has a cough 1-2x a day with yellow phlegm in chest in the morning. He is limited in activities and feels fatigued throughout the day. He is currently on 3L 24hrs. He sleeps relatively well without nightime flares, however the trazadone he is on gives him nightmares. He denies chest tightness, chest pain, and leg swelling.  His current medications are Spiriva, Foradil, Albupterol, and Combivent respimat. He states the Spiriva is hard to breath in because of the level of breath he has to use to take it in. He is requesting a different form of Spiriva such as an inhaler or nebulized treatment.   ROV 04/15/13 -- Hx tobacco/COPD +/- asthma, also COP that has been steroid dependent (pred 2.5mg  qd). Returns for f/u/. Needs a portable concentrator, planning to travel. Goal will be to get it before he travels. His breathing has slowly worsened overtime. He tried anoro x 3-4 weeks but he is not sure it helped. He has some R flank pain. Needs repeat CT scan in February 2015.   ROV 07/15/13 -- Hx tobacco/COPD +/- asthma, also COP that has been steroid dependent (pred 2.5mg  qd). He followed w Dr Melvyn Novas in Nov to be evaluated after he fell here 04/15/13. His lisinopril  was changed to benicar x 2 months and now he is back on lisinopril, inhalers were to be changed to brovana bid + combivent prn. He is now on symbicort bid, spiriva qd, combivent prn from the [facility] 09/23/13 -- Hx tobacco/COPD +/- asthma, also COP that has been steroid dependent (pred 2.5mg  qd, he just  Increased it on his own to 5mg  qd). Last visit we continued Symbicort and Spiriva, combivent prn. He did a trial off of lisinopril but is now back on it. He was desaturated on 3L/min via oximizer. He reports that he has been desaturating on normal [place] at 5L/min. He has had more dyspnea over the last several months. He also hears some wheeze, coughs yellow.    Review of Systems As per HPI   Objective:   Physical Exam Filed Vitals:   09/23/13 1328  BP: 140/86  Pulse: 78  Height: 5' 9.5" (1.765 m)  Weight: 193 lb (87.544 kg)  SpO2: 91%   Gen: Pleasant, well-nourished, in no distress, normal affect  ENT: No lesions, mouth clear, oropharynx clear, no postnasal drip  Neck: No JVD, no TMG, no carotid bruits  Lungs: No use of accessory muscles, some soft basilar rhonchi B  Cardiovascular: RRR, heart sounds normal, no murmur or gallops, no peripheral edema  Musculoskeletal: No deformities, no cyanosis or clubbing  Neuro: alert, non focal  Skin: Warm, no lesions or rashes   07/23/12 --  Comparison: 02/28/2006  Findings: Lungs/pleura: No pleural effusion. Calcified granuloma  identified in the left upper lobe. There is a noncalcified nodule  in the right upper lobe which measures 7 mm, image 29. New from  previous exam. A small nodule in the right lower lobe measures 5  mm, image 37. Also new from previous exam. Mild to moderate changes  of centrilobular emphysema. Nonspecific patchy areas of ground-  glass attenuation are noted in both lower lobes.  Heart/Mediastinum: Calcified atherosclerotic disease affects the  thoracic aorta and great vessels. There are calcifications within  the LAD,  left circumflex and RCA coronary arteries. Mild cardiac  enlargement. No pericardial effusion. No enlarged mediastinal  lymph nodes identified.  Upper abdomen: Limited imaging through the upper abdomen shows  bilateral renal cysts of varying intensities. Indeterminant,  exophytic lesion arising from the upper pole of the left kidney  measures 2 cm, image 58. Previously this measured 1 cm.  Bones/Musculoskeletal: There are no enlarged axillary or  supraclavicular lymph nodes. Multilevel spondylosis is visualized  within the thoracic spine. No worrisome lytic or sclerotic bone  lesions identified.  IMPRESSION:  1. Emphysema.  2. Patchy areas of ground-glass attenuation within the lung bases  consistent with nonspecific alveolitis.  3. Noncalcified nodule within the right lower lobe and right upper  lobe measuring up to 7 mm. If the patient is at high risk for  bronchogenic carcinoma, follow-up chest CT at 3-6 months is  recommended. If the patient is at low risk for bronchogenic  carcinoma, follow-up chest CT at 6-12 months is recommended. This  recommendation follows the consensus statement: Guidelines for  Management of Small Pulmonary Nodules Detected on CT Scans: A  Statement from the [place] as published in Radiology  2005; 237:395-400.  4. There is atherosclerosis of the thoracic aorta, the great  vessels of the mediastinum and the coronary arteries, including  calcified atherosclerotic plaque in the LAD, left circumflex and  RCA coronary arteries.  Atherosclerosis, including three-vessel coronary artery disease.  Please note that although the presence of coronary artery calcium  documents the presence of coronary artery disease, the severity of  this disease and any potential stenosis cannot be assessed on this  non-gated CT examination. Assessment for potential risk factor  modification, dietary therapy or pharmacologic therapy may be  warranted, if clinically  indicated.  5. Indeterminant exophytic lesion arising from the upper pole of  the left kidney. Small renal cell carcinoma cannot be excluded.  Advise further evaluation with contrast enhanced MRI of the  kidneys.      Assessment & Plan:  COPD GOLD II - increase pred to 10mg  qd fo rnow - reconfirm appropriate flow rate fo ro2 - CXR  - continue symbicort and spiriva - rov 4-6 weeks

## 2013-11-04 ENCOUNTER — Telehealth: Payer: Self-pay | Admitting: Emergency Medicine

## 2013-11-04 ENCOUNTER — Encounter: Payer: Self-pay | Admitting: Emergency Medicine

## 2013-11-04 ENCOUNTER — Ambulatory Visit (INDEPENDENT_AMBULATORY_CARE_PROVIDER_SITE_OTHER): Payer: Medicare Other | Admitting: Emergency Medicine

## 2013-11-04 VITALS — BP 124/78 | HR 93 | Ht 69.0 in | Wt 192.0 lb

## 2013-11-04 DIAGNOSIS — J449 Chronic obstructive pulmonary disease, unspecified: Secondary | ICD-10-CM

## 2013-11-04 DIAGNOSIS — K219 Gastro-esophageal reflux disease without esophagitis: Secondary | ICD-10-CM | POA: Insufficient documentation

## 2013-11-04 MED ORDER — ESOMEPRAZOLE MAGNESIUM 40 MG PO PACK: 40.0000 mg | PACK | Freq: Every day | ORAL | Status: AC

## 2013-11-04 MED ORDER — PREDNISONE 10 MG PO TABS: ORAL_TABLET | ORAL | Status: DC

## 2013-11-04 NOTE — Assessment & Plan Note (Signed)
  Try decreasing your prednisone to 7.5mg  daily for 7-10 days to see how your breathing does. If you worsen, then please go back to 10mg  daily We will give you a [prescription for a prednisone taper to have available in case you flare when you are out of town. Please call our office to let us know if / when you need to take this.  Continue your inhaled medications as you are using them  Follow with Dr Lamonte Sakai in 3 months or sooner if you have any problems.

## 2013-11-04 NOTE — Assessment & Plan Note (Signed)
Call our office with the fax information regarding your nexium prescription.

## 2013-11-04 NOTE — Telephone Encounter (Signed)
Advised pt would have RB sign rx and fax to [place].

## 2013-11-04 NOTE — Patient Instructions (Signed)
Call our office with the fax information regarding your nexium prescription Try decreasing your prednisone to 7.5mg  daily for 7-10 days to see how your breathing does. If you worsen, then please go back to 10mg  daily We will give you a [prescription for a prednisone taper to have available in case you flare when you are out of town. Please call our office to let us know if / when you need to take this.  Continue your inhaled medications as you are using them  Follow with Dr Lamonte Sakai in 3 months or sooner if you have any problems.

## 2013-11-04 NOTE — Progress Notes (Signed)
Subjective:    Patient ID: Albert Hansen, male    DOB: [birth year], 80 y.o.   MRN: [id number]  HPI ROV 09/30/12 -- Hx tobacco/COPD +/- asthma, also COP that has been steroid dependent (pred 2.5mg  qd). He has had more SOB, prompting repeat Ct scan 2/'14. Last time added spiriva to foradil, gave a trial of pred 30mg . Also encouraged reliable O2 usage. PFT 4/21 >> moderate AFL, no BD response, normal volumes, decreased DLCO. He believes that the spiriva has been helpful. His mucous production and cough are better. He is also less SOB with exertion. He underwent nuclear stress beginning of April >> reassuring test per pt.  ROV 12/31/12 -- FH with history of COPD C is here for a follow up vist. He states his sx are get getting progressively worse since last visit. He has a cough 1-2x a day with yellow phlegm in chest in the morning. He is limited in activities and feels fatigued throughout the day. He is currently on 3L 24hrs. He sleeps relatively well without nightime flares, however the trazadone he is on gives him nightmares. He denies chest tightness, chest pain, and leg swelling.  His current medications are Spiriva, Foradil, Albupterol, and Combivent respimat. He states the Spiriva is hard to breath in because of the level of breath he has to use to take it in. He is requesting a different form of Spiriva such as an inhaler or nebulized treatment.   ROV 04/15/13 -- Hx tobacco/COPD +/- asthma, also COP that has been steroid dependent (pred 2.5mg  qd). Returns for f/u/. Needs a portable concentrator, planning to travel. Goal will be to get it before he travels. His breathing has slowly worsened overtime. He tried anoro x 3-4 weeks but he is not sure it helped. He has some R flank pain. Needs repeat CT scan in February 2015.   ROV 07/15/13 -- Hx tobacco/COPD +/- asthma, also COP that has been steroid dependent (pred 2.5mg  qd). He followed w Dr Melvyn Novas in Nov to be evaluated after he fell here 04/15/13. His lisinopril  was changed to benicar x 2 months and now he is back on lisinopril, inhalers were to be changed to brovana bid + combivent prn. He is now on symbicort bid, spiriva qd, combivent prn from the [place] 09/23/13 -- Hx tobacco/COPD +/- asthma, also COP that has been steroid dependent (pred 2.5mg  qd, he just  Increased it on his own to 5mg  qd). Last visit we continued Symbicort and Spiriva, combivent prn. He did a trial off of lisinopril but is now back on it. He was desaturated on 3L/min via oximizer. He reports that he has been desaturating on normal [place] at 5L/min. He has had more dyspnea over the last several months. He also hears some wheeze, coughs yellow.   ROV 11/04/13 --   Follows up for tobacco/COPD +/- asthma, also COP that has been steroid dependent. His current dose > 10mg . He has GERD sx, has been on Nexium in the past but it is very expensive. He relates the increased GERD to the increased pred. The pred has stabilized his breathing. Occas cough, no sputum. The GERD makes him cough when he flares.    Review of Systems As per HPI   Objective:   Physical Exam Filed Vitals:   11/04/13 1335  BP: 124/78  Pulse: 93  Height: 5\' 9"  (1.753 m)  Weight: 192 lb (87.091 kg)  SpO2: 98%   Gen: Pleasant, well-nourished, in no distress, normal affect  ENT: No lesions, mouth clear, oropharynx clear, no postnasal drip  Neck: No JVD, no TMG, no carotid bruits  Lungs: No use of accessory muscles, some soft basilar rhonchi B  Cardiovascular: RRR, heart sounds normal, no murmur or gallops, no peripheral edema  Musculoskeletal: No deformities, no cyanosis or clubbing  Neuro: alert, non focal  Skin: Warm, no lesions or rashes   07/23/12 --  Comparison: 02/28/2006  Findings: Lungs/pleura: No pleural effusion. Calcified granuloma  identified in the left upper lobe. There is a noncalcified nodule  in the right upper lobe which measures 7 mm, image 29. New from  previous exam. A small nodule in the right  lower lobe measures 5  mm, image 37. Also new from previous exam. Mild to moderate changes  of centrilobular emphysema. Nonspecific patchy areas of ground-  glass attenuation are noted in both lower lobes.  Heart/Mediastinum: Calcified atherosclerotic disease affects the  thoracic aorta and great vessels. There are calcifications within  the LAD, left circumflex and RCA coronary arteries. Mild cardiac  enlargement. No pericardial effusion. No enlarged mediastinal  lymph nodes identified.  Upper abdomen: Limited imaging through the upper abdomen shows  bilateral renal cysts of varying intensities. Indeterminant,  exophytic lesion arising from the upper pole of the left kidney  measures 2 cm, image 58. Previously this measured 1 cm.  Bones/Musculoskeletal: There are no enlarged axillary or  supraclavicular lymph nodes. Multilevel spondylosis is visualized  within the thoracic spine. No worrisome lytic or sclerotic bone  lesions identified.  IMPRESSION:  1. Emphysema.  2. Patchy areas of ground-glass attenuation within the lung bases  consistent with nonspecific alveolitis.  3. Noncalcified nodule within the right lower lobe and right upper  lobe measuring up to 7 mm. If the patient is at high risk for  bronchogenic carcinoma, follow-up chest CT at 3-6 months is  recommended. If the patient is at low risk for bronchogenic  carcinoma, follow-up chest CT at 6-12 months is recommended. This  recommendation follows the consensus statement: Guidelines for  Management of Small Pulmonary Nodules Detected on CT Scans: A  Statement from the [employer] as published in Radiology  2005; 237:395-400.  4. There is atherosclerosis of the thoracic aorta, the great  vessels of the mediastinum and the coronary arteries, including  calcified atherosclerotic plaque in the LAD, left circumflex and  RCA coronary arteries.  Atherosclerosis, including three-vessel coronary artery disease.  Please  note that although the presence of coronary artery calcium  documents the presence of coronary artery disease, the severity of  this disease and any potential stenosis cannot be assessed on this  non-gated CT examination. Assessment for potential risk factor  modification, dietary therapy or pharmacologic therapy may be  warranted, if clinically indicated.  5. Indeterminant exophytic lesion arising from the upper pole of  the left kidney. Small renal cell carcinoma cannot be excluded.  Advise further evaluation with contrast enhanced MRI of the  kidneys.      Assessment & Plan:  COPD GOLD II  Try decreasing your prednisone to 7.5mg  daily for 7-10 days to see how your breathing does. If you worsen, then please go back to 10mg  daily We will give you a [prescription for a prednisone taper to have available in case you flare when you are out of town. Please call our office to let us know if / when you need to take this.  Continue your inhaled medications as you are using them  Follow with Dr Lamonte Sakai  in 3 months or sooner if you have any problems.  GERD (gastroesophageal reflux disease) Call our office with the fax information regarding your nexium prescription.

## 2013-11-27 ENCOUNTER — Other Ambulatory Visit: Payer: Self-pay | Admitting: Dermatology

## 2014-02-24 ENCOUNTER — Telehealth: Payer: Self-pay | Admitting: Emergency Medicine

## 2014-02-24 MED ORDER — PREDNISONE 10 MG PO TABS: ORAL_TABLET | ORAL | Status: DC

## 2014-02-24 MED ORDER — LEVOFLOXACIN 750 MG PO TABS: 750.0000 mg | ORAL_TABLET | Freq: Every day | ORAL | Status: DC

## 2014-02-24 NOTE — Telephone Encounter (Signed)
Needs to start doxy 100mg  bid x 7 days, pred taper > Take 40mg  daily for 3 days, then 30mg  daily for 3 days, then 20mg  daily for 3 days, then 10mg  daily for 3 days, then stop. Also needs an OV

## 2014-02-24 NOTE — Telephone Encounter (Signed)
Ok to change it to levaquin x 7 days

## 2014-02-24 NOTE — Telephone Encounter (Signed)
Called, spoke with pt.  Informed him of below per RB.  He verbalized understanding.  Pt scheduled to see RB on Sept 25 at 10:45 am -- appt per pt's request d/t being out of town. Dr. Lamonte Sakai, pt states doxy doesn't work well for him.  He is specifically requesting levaquin 750 mg.  Please advise.  Thank you.

## 2014-02-24 NOTE — Telephone Encounter (Signed)
Spoke with the pt and notified of recs per RB  Pt verbalized understanding  Rx was sent to pharm

## 2014-02-24 NOTE — Telephone Encounter (Signed)
Called spoke with pt. He uses 4 1/2 liters O2. His O2 level has been dropping into the 70's on O2. He is feeling like chest is tight. Throat feels clogged at night but uses will suck on cough drop and it helps. No openings tomorrow. Please advise RB thanks  Allergies  Allergen Reactions  . Cefaclor      Current Outpatient Prescriptions on File Prior to Visit  Medication Sig Dispense Refill  . allopurinol (ZYLOPRIM) 100 MG tablet Take 200 mg by mouth daily.      . budesonide-formoterol (SYMBICORT) 160-4.5 MCG/ACT inhaler Inhale 2 puffs into the lungs daily.      . COMBIVENT RESPIMAT 20-100 MCG/ACT AERS respimat 1 puff every 6 (six) hours as needed.       . diazepam (VALIUM) 5 MG tablet Take by mouth daily. 2.5 mg daily      . esomeprazole (NEXIUM) 40 MG packet Take 40 mg by mouth daily before breakfast. [id number]  90 each  3  . HYDROcodone-acetaminophen (NORCO/VICODIN) 5-325 MG per tablet Take 1 tablet by mouth every 6 (six) hours as needed for moderate pain.  40 tablet  0  . insulin NPH (HUMULIN N,NOVOLIN N) 100 UNIT/ML injection Inject into the skin. As directed      . insulin regular (NOVOLIN R,HUMULIN R) 100 units/mL injection Inject into the skin 2 (two) times daily before a meal.      . lisinopril (PRINIVIL,ZESTRIL) 40 MG tablet Take 40 mg by mouth daily.      . metoprolol succinate (TOPROL-XL) 50 MG 24 hr tablet Take 50 mg by mouth daily. 1/2 pill in AM (25mg ) and 1/2 pill in PM (25mg )      . NEXIUM 40 MG capsule Daily.      . pravastatin (PRAVACHOL) 40 MG tablet Take 40 mg by mouth daily.      . predniSONE (DELTASONE) 10 MG tablet Take 1 tablet (10 mg total) by mouth daily with breakfast.  30 tablet  6  . predniSONE (DELTASONE) 10 MG tablet 40 mg x3 days, 30mg  x3, 20 mg x3 days, 10 mg x3 days stop  30 tablet  0  . Tamsulosin HCl (FLOMAX) 0.4 MG CAPS Take 0.4 mg by mouth daily.      Marland Kitchen tiotropium (SPIRIVA) 18 MCG inhalation capsule Place 18 mcg into inhaler and inhale daily.      .  traZODone (DESYREL) 50 MG tablet Take 50 mg by mouth at bedtime.      . triamcinolone cream (KENALOG) 0.1 % 1 application daily.       No current facility-administered medications on file prior to visit.

## 2014-03-06 ENCOUNTER — Encounter: Payer: Self-pay | Admitting: Emergency Medicine

## 2014-03-06 ENCOUNTER — Ambulatory Visit (INDEPENDENT_AMBULATORY_CARE_PROVIDER_SITE_OTHER): Payer: Medicare Other | Admitting: Emergency Medicine

## 2014-03-06 VITALS — BP 128/82 | HR 79 | Temp 98.3°F | Ht 70.0 in | Wt 195.8 lb

## 2014-03-06 DIAGNOSIS — J8409 Other alveolar and parieto-alveolar conditions: Secondary | ICD-10-CM

## 2014-03-06 DIAGNOSIS — R918 Other nonspecific abnormal finding of lung field: Secondary | ICD-10-CM

## 2014-03-06 DIAGNOSIS — J4489 Other specified chronic obstructive pulmonary disease: Secondary | ICD-10-CM

## 2014-03-06 DIAGNOSIS — J449 Chronic obstructive pulmonary disease, unspecified: Secondary | ICD-10-CM

## 2014-03-06 MED ORDER — LEVOFLOXACIN 500 MG PO TABS: 500.0000 mg | ORAL_TABLET | Freq: Every day | ORAL | Status: DC

## 2014-03-06 NOTE — Progress Notes (Signed)
Subjective:    Patient ID: Albert Hansen, male    DOB: 1930-02-04, 80 y.o.   MRN: [id number]  HPI ROV 09/30/12 -- Hx tobacco/COPD +/- asthma, also COP that has been steroid dependent (pred 2.$RemoveBeforeDEI'5mg'AVlBcOMoNFXconee$  qd). He has had more SOB, prompting repeat Ct scan 2/'14. Last time added spiriva to foradil, gave a trial of pred $Remov'30mg'diiKhR$ . Also encouraged reliable O2 usage. PFT 4/21 >> moderate AFL, no BD response, normal volumes, decreased DLCO. He believes that the spiriva has been helpful. His mucous production and cough are better. He is also less SOB with exertion. He underwent nuclear stress beginning of April >> reassuring test per pt.  ROV 12/31/12 -- FH with history of COPD C is here for a follow up vist. He states his sx are get getting progressively worse since last visit. He has a cough 1-2x a day with yellow phlegm in chest in the morning. He is limited in activities and feels fatigued throughout the day. He is currently on 3L 24hrs. He sleeps relatively well without nightime flares, however the trazadone he is on gives him nightmares. He denies chest tightness, chest pain, and leg swelling.  His current medications are Spiriva, Foradil, Albupterol, and Combivent respimat. He states the Spiriva is hard to breath in because of the level of breath he has to use to take it in. He is requesting a different form of Spiriva such as an inhaler or nebulized treatment.   ROV 04/15/13 -- Hx tobacco/COPD +/- asthma, also COP that has been steroid dependent (pred 2.$RemoveBeforeDEI'5mg'imxEAlVsjTsARxRU$  qd). Returns for f/u/. Needs a portable concentrator, planning to travel. Goal will be to get it before he travels. His breathing has slowly worsened overtime. He tried anoro x 3-4 weeks but he is not sure it helped. He has some R flank pain. Needs repeat CT scan in February 2015.   ROV 07/15/13 -- Hx tobacco/COPD +/- asthma, also COP that has been steroid dependent (pred 2.$RemoveBeforeDEI'5mg'RNxXrbldWzpRNoYE$  qd). He followed w Dr Melvyn Novas in Nov to be evaluated after he fell here 04/15/13. His lisinopril  was changed to benicar x 2 months and now he is back on lisinopril, inhalers were to be changed to brovana bid + combivent prn. He is now on symbicort bid, spiriva qd, combivent prn from the [place] 09/23/13 -- Hx tobacco/COPD +/- asthma, also COP that has been steroid dependent (pred 2.$RemoveBeforeDEI'5mg'NmudobZcJkzOGphl$  qd, he just  Increased it on his own to $Remo'5mg'ltpXs$  qd). Last visit we continued Symbicort and Spiriva, combivent prn. He did a trial off of lisinopril but is now back on it. He was desaturated on 3L/min via oximizer. He reports that he has been desaturating on normal [place] at 5L/min. He has had more dyspnea over the last several months. He also hears some wheeze, coughs yellow.   ROV 11/04/13 --   Follows up for tobacco/COPD +/- asthma, also COP that has been steroid dependent. His current dose > $Remo'10mg'ksUKG$ . He has GERD sx, has been on Nexium in the past but it is very expensive. He relates the increased GERD to the increased pred. The pred has stabilized his breathing. Occas cough, no sputum. The GERD makes him cough when he flares.   ROV 03/06/14 -- hx severe COPD, also steroid-responsive infiltrates labelled COP for which he is on chronic pred. He has a 7mm and 74mm pulm nodule RUL and RLL on CT chest 2/'14.  He was just treated for an AE with pred and levaquin > he is better, maybe not  quite back to baseline.    Review of Systems As per HPI   Objective:   Physical Exam Filed Vitals:   03/06/14 1039  BP: 128/82  Pulse: 79  Temp: 98.3 F (36.8 C)  TempSrc: Oral  Height: 5\' 10"  (1.778 m)  Weight: 195 lb 12.8 oz (88.814 kg)  SpO2: 98%   Gen: Pleasant, well-nourished, in no distress, normal affect  ENT: No lesions, mouth clear, oropharynx clear, no postnasal drip  Neck: No JVD, no TMG, no carotid bruits  Lungs: No use of accessory muscles, some soft basilar rhonchi B  Cardiovascular: RRR, heart sounds normal, no murmur or gallops, no peripheral edema  Musculoskeletal: No deformities, no cyanosis or clubbing  Neuro:  alert, non focal  Skin: Warm, no lesions or rashes   07/23/12 --  Comparison: 02/28/2006  Findings: Lungs/pleura: No pleural effusion. Calcified granuloma  identified in the left upper lobe. There is a noncalcified nodule  in the right upper lobe which measures 7 mm, image 29. New from  previous exam. A small nodule in the right lower lobe measures 5  mm, image 37. Also new from previous exam. Mild to moderate changes  of centrilobular emphysema. Nonspecific patchy areas of ground-  glass attenuation are noted in both lower lobes.  Heart/Mediastinum: Calcified atherosclerotic disease affects the  thoracic aorta and great vessels. There are calcifications within  the LAD, left circumflex and RCA coronary arteries. Mild cardiac  enlargement. No pericardial effusion. No enlarged mediastinal  lymph nodes identified.  Upper abdomen: Limited imaging through the upper abdomen shows  bilateral renal cysts of varying intensities. Indeterminant,  exophytic lesion arising from the upper pole of the left kidney  measures 2 cm, image 58. Previously this measured 1 cm.  Bones/Musculoskeletal: There are no enlarged axillary or  supraclavicular lymph nodes. Multilevel spondylosis is visualized  within the thoracic spine. No worrisome lytic or sclerotic bone  lesions identified.  IMPRESSION:  1. Emphysema.  2. Patchy areas of ground-glass attenuation within the lung bases  consistent with nonspecific alveolitis.  3. Noncalcified nodule within the right lower lobe and right upper  lobe measuring up to 7 mm. If the patient is at high risk for  bronchogenic carcinoma, follow-up chest CT at 3-6 months is  recommended. If the patient is at low risk for bronchogenic  carcinoma, follow-up chest CT at 6-12 months is recommended. This  recommendation follows the consensus statement: Guidelines for  Management of Small Pulmonary Nodules Detected on CT Scans: A  Statement from the [place] as  published in Radiology  2005; 237:395-400.  4. There is atherosclerosis of the thoracic aorta, the great  vessels of the mediastinum and the coronary arteries, including  calcified atherosclerotic plaque in the LAD, left circumflex and  RCA coronary arteries.  Atherosclerosis, including three-vessel coronary artery disease.  Please note that although the presence of coronary artery calcium  documents the presence of coronary artery disease, the severity of  this disease and any potential stenosis cannot be assessed on this  non-gated CT examination. Assessment for potential risk factor  modification, dietary therapy or pharmacologic therapy may be  warranted, if clinically indicated.  5. Indeterminant exophytic lesion arising from the upper pole of  the left kidney. Small renal cell carcinoma cannot be excluded.  Advise further evaluation with contrast enhanced MRI of the  kidneys.      Assessment & Plan:  COPD GOLD c-d Difficult case to sort out - he has severe COPD but also  a hx of pneumonitis that responded to steroids. At this time no wheeze on exam. Continue current BD's. He wants a script for levaquin to have on reserve in case he has problems.   OTH SPEC ALVEOL&PARIETOALVEOL PNEUMONOPATHIES - repeat CT chest to assess for evolving infiltrates given his flares and overall decline.   Pulmonary nodules Need to repeat CT scan of chest to look for interval change

## 2014-03-06 NOTE — Assessment & Plan Note (Addendum)
Difficult case to sort out - he has severe COPD but also a hx of pneumonitis that responded to steroids. At this time no wheeze on exam. Continue current BD's. He wants a script for levaquin to have on reserve in case he has problems.

## 2014-03-06 NOTE — Assessment & Plan Note (Signed)
Need to repeat CT scan of chest to look for interval change

## 2014-03-06 NOTE — Assessment & Plan Note (Signed)
-   repeat CT chest to assess for evolving infiltrates given his flares and overall decline.

## 2014-03-06 NOTE — Patient Instructions (Signed)
We will continue your current medications as you are taking them  Wear your oxygen at all times.  We will repeat your CT scan of the chest.  Follow with Dr Lamonte Sakai in 2 months or sooner if you have any problems.

## 2014-03-11 ENCOUNTER — Ambulatory Visit (INDEPENDENT_AMBULATORY_CARE_PROVIDER_SITE_OTHER)
Admission: RE | Admit: 2014-03-11 | Discharge: 2014-03-11 | Disposition: A | Discharge status: Home / Self Care | Payer: Medicare Other | Source: Ambulatory Visit | Attending: Emergency Medicine | Admitting: Emergency Medicine

## 2014-03-11 DIAGNOSIS — J449 Chronic obstructive pulmonary disease, unspecified: Secondary | ICD-10-CM

## 2014-03-11 DIAGNOSIS — J8409 Other alveolar and parieto-alveolar conditions: Secondary | ICD-10-CM

## 2014-03-13 ENCOUNTER — Telehealth: Payer: Self-pay | Admitting: Emergency Medicine

## 2014-03-13 NOTE — Telephone Encounter (Signed)
Patient notified of CT results.  Verbalized understanding.

## 2014-03-13 NOTE — Telephone Encounter (Signed)
Dr Lamonte Sakai, please advise on CT results from 03/11/14 Pt aware and requests call back today.  Please advise thanks

## 2014-03-13 NOTE — Telephone Encounter (Signed)
Please notify Mr Albert Hansen that his CT scan does not show any return of inflammation or BOOP. It does show his emphysema.

## 2014-04-13 ENCOUNTER — Other Ambulatory Visit: Payer: Self-pay | Admitting: Dermatology

## 2014-04-14 ENCOUNTER — Encounter: Payer: Self-pay | Admitting: Emergency Medicine

## 2014-04-14 ENCOUNTER — Ambulatory Visit (INDEPENDENT_AMBULATORY_CARE_PROVIDER_SITE_OTHER): Payer: Medicare Other | Admitting: Emergency Medicine

## 2014-04-14 VITALS — BP 128/84 | HR 86 | Temp 98.3°F | Ht 69.5 in | Wt 200.0 lb

## 2014-04-14 DIAGNOSIS — J449 Chronic obstructive pulmonary disease, unspecified: Secondary | ICD-10-CM

## 2014-04-14 DIAGNOSIS — J8489 Other specified interstitial pulmonary diseases: Secondary | ICD-10-CM

## 2014-04-14 MED ORDER — TIOTROPIUM BROMIDE MONOHYDRATE 2.5 MCG/ACT IN AERS: 2.0000 | INHALATION_SPRAY | Freq: Every day | RESPIRATORY_TRACT | Status: AC

## 2014-04-14 MED ORDER — PREDNISONE 10 MG PO TABS: ORAL_TABLET | ORAL | Status: AC

## 2014-04-14 NOTE — Assessment & Plan Note (Signed)
No evidence of recurrent COP on his CT scan from September 2015. His symptoms appear to be related to his obstructive lung disease

## 2014-04-14 NOTE — Assessment & Plan Note (Signed)
Severe COPD that has been steroid dependent. He is currently on prednisone 10 mg daily. He is interested in changing his Spiriva to the respite met version due to difficulty inhaling the medication. He has required prednisone tapers multiple times since last visit.  - Change Spiriva to the respiratory aversion to see if he prefers this. If so then he will ask to have his prescription changed at the [place] - Continue Symbicort - Continue oxygen via [employer] - Increase prednisone to 20 mg daily for the next 2 weeks and then decrease to 15 mg daily. He will stay on this dose until our next visit - rov1

## 2014-04-14 NOTE — Patient Instructions (Signed)
Please try Spiriva the Respimat version to see if it is easier to deliver. Take 2 puffs daily. If it is better, ask to get this changed at the [facility] twice a day Use combivent as needed Wear your oxygen at all times Increase your prednisone to 20mg  daily for 2 weeks, then go to 15mg  daily and stay there until our next visit.  Follow with Dr Lamonte Sakai in 1 month

## 2014-04-14 NOTE — Progress Notes (Signed)
Subjective:    Patient ID: Albert Hansen, male    DOB: 1930-02-04, 80 y.o.   MRN: [id number]  HPI ROV 09/30/12 -- Hx tobacco/COPD +/- asthma, also COP that has been steroid dependent (pred 2.$RemoveBeforeDEI'5mg'AVlBcOMoNFXconee$  qd). He has had more SOB, prompting repeat Ct scan 2/'14. Last time added spiriva to foradil, gave a trial of pred $Remov'30mg'diiKhR$ . Also encouraged reliable O2 usage. PFT 4/21 >> moderate AFL, no BD response, normal volumes, decreased DLCO. He believes that the spiriva has been helpful. His mucous production and cough are better. He is also less SOB with exertion. He underwent nuclear stress beginning of April >> reassuring test per pt.  ROV 12/31/12 -- FH with history of COPD C is here for a follow up vist. He states his sx are get getting progressively worse since last visit. He has a cough 1-2x a day with yellow phlegm in chest in the morning. He is limited in activities and feels fatigued throughout the day. He is currently on 3L 24hrs. He sleeps relatively well without nightime flares, however the trazadone he is on gives him nightmares. He denies chest tightness, chest pain, and leg swelling.  His current medications are Spiriva, Foradil, Albupterol, and Combivent respimat. He states the Spiriva is hard to breath in because of the level of breath he has to use to take it in. He is requesting a different form of Spiriva such as an inhaler or nebulized treatment.   ROV 04/15/13 -- Hx tobacco/COPD +/- asthma, also COP that has been steroid dependent (pred 2.$RemoveBeforeDEI'5mg'imxEAlVsjTsARxRU$  qd). Returns for f/u/. Needs a portable concentrator, planning to travel. Goal will be to get it before he travels. His breathing has slowly worsened overtime. He tried anoro x 3-4 weeks but he is not sure it helped. He has some R flank pain. Needs repeat CT scan in February 2015.   ROV 07/15/13 -- Hx tobacco/COPD +/- asthma, also COP that has been steroid dependent (pred 2.$RemoveBeforeDEI'5mg'RNxXrbldWzpRNoYE$  qd). He followed w Dr Melvyn Novas in Nov to be evaluated after he fell here 04/15/13. His lisinopril  was changed to benicar x 2 months and now he is back on lisinopril, inhalers were to be changed to brovana bid + combivent prn. He is now on symbicort bid, spiriva qd, combivent prn from the [place] 09/23/13 -- Hx tobacco/COPD +/- asthma, also COP that has been steroid dependent (pred 2.$RemoveBeforeDEI'5mg'NmudobZcJkzOGphl$  qd, he just  Increased it on his own to $Remo'5mg'ltpXs$  qd). Last visit we continued Symbicort and Spiriva, combivent prn. He did a trial off of lisinopril but is now back on it. He was desaturated on 3L/min via oximizer. He reports that he has been desaturating on normal [place] at 5L/min. He has had more dyspnea over the last several months. He also hears some wheeze, coughs yellow.   ROV 11/04/13 --   Follows up for tobacco/COPD +/- asthma, also COP that has been steroid dependent. His current dose > $Remo'10mg'ksUKG$ . He has GERD sx, has been on Nexium in the past but it is very expensive. He relates the increased GERD to the increased pred. The pred has stabilized his breathing. Occas cough, no sputum. The GERD makes him cough when he flares.   ROV 03/06/14 -- hx severe COPD, also steroid-responsive infiltrates labelled COP for which he is on chronic pred. He has a 7mm and 74mm pulm nodule RUL and RLL on CT chest 2/'14.  He was just treated for an AE with pred and levaquin > he is better, maybe not  quite back to baseline.   ROV 04/14/14 -- follows up for severe COPD, hypoxemia, hx of presumed COP (was responsive to steroids). He has frequent exacerbations - has needed prednisone on multiple occasions. He is chronically on pred $Remov'10mg'ZkUzgO$ .  He is having trouble delivering the Spiriva capsule.    Review of Systems As per HPI   Objective:   Physical Exam Filed Vitals:   04/14/14 1536  BP: 128/84  Pulse: 86  Temp: 98.3 F (36.8 C)  TempSrc: Oral  Height: 5' 9.5" (1.765 m)  Weight: 200 lb (90.719 kg)  SpO2: 92%   Gen: Pleasant, well-nourished, in no distress, normal affect  ENT: No lesions, mouth clear, oropharynx clear, no postnasal drip   Neck: No JVD, no TMG, no carotid bruits  Lungs: No use of accessory muscles, some soft basilar rhonchi B  Cardiovascular: RRR, heart sounds normal, no murmur or gallops, no peripheral edema  Musculoskeletal: No deformities, no cyanosis or clubbing  Neuro: alert, non focal  Skin: Warm, no lesions or rashes  03/11/14 --  COMPARISON: 07/23/2012.  FINDINGS: No pathologically enlarged mediastinal or axillary lymph nodes. Hilar regions are difficult to definitively evaluate without IV contrast but appear grossly unremarkable. Three-vessel coronary artery calcification. Heart size normal. No pericardial effusion.  Centrilobular and paraseptal emphysema. No subpleural reticulation, traction bronchiectasis/ bronchiolectasis, architectural distortion or honeycombing. Subpleural scarring in the apices. Calcified granuloma is in the left upper lobe. Scattered subpleural nodular densities measure up to 6 mm, stable from 07/23/2012 and therefore considered benign. No pleural fluid. Airway is unremarkable. No air trapping.  Incidental imaging of the upper abdomen shows the visualized portion of the liver to be grossly unremarkable. Stones are seen in the gallbladder. Adrenal glands are unremarkable. Low-attenuation lesions in the kidneys measure up to 4.1 cm on the left, incompletely imaged. Visualized portions of the spleen, pancreas, stomach and bowel are grossly unremarkable. No upper abdominal adenopathy.  No worrisome lytic or sclerotic lesions. Degenerative changes are seen in the spine.  IMPRESSION: 1. Centrilobular and paraseptal emphysema. No evidence of superimposed fibrotic interstitial lung disease. 2. Three-vessel coronary artery calcification. 3. Cholelithiasis.     Assessment & Plan:  COPD GOLD c-d Severe COPD that has been steroid dependent. He is currently on prednisone 10 mg daily. He is interested in changing his Spiriva to the respite met version due to  difficulty inhaling the medication. He has required prednisone tapers multiple times since last visit.  - Change Spiriva to the respiratory aversion to see if he prefers this. If so then he will ask to have his prescription changed at the [place] - Continue Symbicort - Continue oxygen via Oxymizer - Increase prednisone to 20 mg daily for the next 2 weeks and then decrease to 15 mg daily. He will stay on this dose until our next visit - rov1   BOOP (bronchiolitis obliterans with organizing pneumonia) No evidence of recurrent COP on his CT scan from September 2015. His symptoms appear to be related to his obstructive lung disease

## 2014-04-30 ENCOUNTER — Ambulatory Visit: Payer: Medicare Other | Admitting: Emergency Medicine

## 2014-05-13 ENCOUNTER — Inpatient Hospital Stay (HOSPITAL_COMMUNITY)
Admission: EM | Admit: 2014-05-13 | Discharge: 2014-06-12 | DRG: 871 | Disposition: E | Payer: Medicare Other | Attending: Internal Medicine | Admitting: Internal Medicine

## 2014-05-13 ENCOUNTER — Encounter (HOSPITAL_COMMUNITY): Payer: Self-pay

## 2014-05-13 ENCOUNTER — Telehealth: Payer: Self-pay | Admitting: Emergency Medicine

## 2014-05-13 ENCOUNTER — Inpatient Hospital Stay (HOSPITAL_COMMUNITY): Payer: Medicare Other

## 2014-05-13 ENCOUNTER — Emergency Department (HOSPITAL_COMMUNITY): Payer: Medicare Other

## 2014-05-13 DIAGNOSIS — Z66 Do not resuscitate: Secondary | ICD-10-CM | POA: Diagnosis present

## 2014-05-13 DIAGNOSIS — D849 Immunodeficiency, unspecified: Secondary | ICD-10-CM | POA: Diagnosis present

## 2014-05-13 DIAGNOSIS — J441 Chronic obstructive pulmonary disease with (acute) exacerbation: Secondary | ICD-10-CM | POA: Diagnosis present

## 2014-05-13 DIAGNOSIS — I469 Cardiac arrest, cause unspecified: Secondary | ICD-10-CM | POA: Diagnosis not present

## 2014-05-13 DIAGNOSIS — E119 Type 2 diabetes mellitus without complications: Secondary | ICD-10-CM

## 2014-05-13 DIAGNOSIS — Z7952 Long term (current) use of systemic steroids: Secondary | ICD-10-CM

## 2014-05-13 DIAGNOSIS — N189 Chronic kidney disease, unspecified: Secondary | ICD-10-CM | POA: Diagnosis present

## 2014-05-13 DIAGNOSIS — N179 Acute kidney failure, unspecified: Secondary | ICD-10-CM | POA: Diagnosis present

## 2014-05-13 DIAGNOSIS — E2740 Unspecified adrenocortical insufficiency: Secondary | ICD-10-CM | POA: Diagnosis present

## 2014-05-13 DIAGNOSIS — L0390 Cellulitis, unspecified: Secondary | ICD-10-CM

## 2014-05-13 DIAGNOSIS — J9620 Acute and chronic respiratory failure, unspecified whether with hypoxia or hypercapnia: Secondary | ICD-10-CM | POA: Diagnosis present

## 2014-05-13 DIAGNOSIS — K219 Gastro-esophageal reflux disease without esophagitis: Secondary | ICD-10-CM

## 2014-05-13 DIAGNOSIS — I9589 Other hypotension: Secondary | ICD-10-CM | POA: Insufficient documentation

## 2014-05-13 DIAGNOSIS — J9621 Acute and chronic respiratory failure with hypoxia: Secondary | ICD-10-CM | POA: Insufficient documentation

## 2014-05-13 DIAGNOSIS — W5501XA Bitten by cat, initial encounter: Secondary | ICD-10-CM | POA: Diagnosis present

## 2014-05-13 DIAGNOSIS — L03114 Cellulitis of left upper limb: Secondary | ICD-10-CM | POA: Diagnosis present

## 2014-05-13 DIAGNOSIS — R6521 Severe sepsis with septic shock: Secondary | ICD-10-CM | POA: Diagnosis present

## 2014-05-13 DIAGNOSIS — A419 Sepsis, unspecified organism: Secondary | ICD-10-CM | POA: Diagnosis present

## 2014-05-13 DIAGNOSIS — E1165 Type 2 diabetes mellitus with hyperglycemia: Secondary | ICD-10-CM | POA: Diagnosis present

## 2014-05-13 DIAGNOSIS — I959 Hypotension, unspecified: Secondary | ICD-10-CM | POA: Diagnosis present

## 2014-05-13 DIAGNOSIS — C4490 Unspecified malignant neoplasm of skin, unspecified: Secondary | ICD-10-CM | POA: Diagnosis present

## 2014-05-13 DIAGNOSIS — IMO0002 Reserved for concepts with insufficient information to code with codable children: Secondary | ICD-10-CM | POA: Diagnosis present

## 2014-05-13 DIAGNOSIS — I129 Hypertensive chronic kidney disease with stage 1 through stage 4 chronic kidney disease, or unspecified chronic kidney disease: Secondary | ICD-10-CM | POA: Diagnosis present

## 2014-05-13 DIAGNOSIS — Z87891 Personal history of nicotine dependence: Secondary | ICD-10-CM | POA: Diagnosis not present

## 2014-05-13 DIAGNOSIS — J9610 Chronic respiratory failure, unspecified whether with hypoxia or hypercapnia: Secondary | ICD-10-CM | POA: Diagnosis present

## 2014-05-13 DIAGNOSIS — R531 Weakness: Secondary | ICD-10-CM | POA: Diagnosis present

## 2014-05-13 DIAGNOSIS — D899 Disorder involving the immune mechanism, unspecified: Secondary | ICD-10-CM

## 2014-05-13 DIAGNOSIS — I248 Other forms of acute ischemic heart disease: Secondary | ICD-10-CM | POA: Diagnosis present

## 2014-05-13 DIAGNOSIS — L03113 Cellulitis of right upper limb: Secondary | ICD-10-CM

## 2014-05-13 DIAGNOSIS — I471 Supraventricular tachycardia: Secondary | ICD-10-CM

## 2014-05-13 DIAGNOSIS — R778 Other specified abnormalities of plasma proteins: Secondary | ICD-10-CM | POA: Diagnosis present

## 2014-05-13 DIAGNOSIS — Z794 Long term (current) use of insulin: Secondary | ICD-10-CM | POA: Diagnosis not present

## 2014-05-13 DIAGNOSIS — J9611 Chronic respiratory failure with hypoxia: Secondary | ICD-10-CM

## 2014-05-13 DIAGNOSIS — R7989 Other specified abnormal findings of blood chemistry: Secondary | ICD-10-CM

## 2014-05-13 DIAGNOSIS — R748 Abnormal levels of other serum enzymes: Secondary | ICD-10-CM

## 2014-05-13 DIAGNOSIS — R0602 Shortness of breath: Secondary | ICD-10-CM

## 2014-05-13 HISTORY — DX: Dyspnea, unspecified: R06.00

## 2014-05-13 HISTORY — DX: Type 2 diabetes mellitus without complications: E11.9

## 2014-05-13 HISTORY — DX: Other specified interstitial pulmonary diseases: J84.89

## 2014-05-13 HISTORY — DX: Malignant (primary) neoplasm, unspecified: C80.1

## 2014-05-13 HISTORY — DX: Gastro-esophageal reflux disease without esophagitis: K21.9

## 2014-05-13 HISTORY — DX: Unspecified asthma, uncomplicated: J45.909

## 2014-05-13 HISTORY — DX: Essential (primary) hypertension: I10

## 2014-05-13 HISTORY — DX: Chronic obstructive pulmonary disease, unspecified: J44.9

## 2014-05-13 HISTORY — DX: Chronic respiratory failure, unspecified whether with hypoxia or hypercapnia: J96.10

## 2014-05-13 HISTORY — DX: Other nonspecific abnormal finding of lung field: R91.8

## 2014-05-13 LAB — COMPREHENSIVE METABOLIC PANEL
ALT: 10 U/L (ref 0–53)
ANION GAP: 18 — AB (ref 5–15)
AST: 16 U/L (ref 0–37)
Albumin: 3 g/dL — ABNORMAL LOW (ref 3.5–5.2)
Alkaline Phosphatase: 51 U/L (ref 39–117)
BUN: 27 mg/dL — ABNORMAL HIGH (ref 6–23)
CALCIUM: 9 mg/dL (ref 8.4–10.5)
CO2: 25 meq/L (ref 19–32)
Chloride: 100 mEq/L (ref 96–112)
Creatinine, Ser: 2.41 mg/dL — ABNORMAL HIGH (ref 0.50–1.35)
GFR, EST AFRICAN AMERICAN: 27 mL/min — AB (ref >90)
GFR, EST NON AFRICAN AMERICAN: 23 mL/min — AB (ref >90)
GLUCOSE: 142 mg/dL — AB (ref 70–99)
Potassium: 4.7 mEq/L (ref 3.7–5.3)
Sodium: 143 mEq/L (ref 137–147)
TOTAL PROTEIN: 6.4 g/dL (ref 6.0–8.3)
Total Bilirubin: 0.8 mg/dL (ref 0.3–1.2)

## 2014-05-13 LAB — I-STAT CG4 LACTIC ACID, ED
Lactic Acid, Venous: 2.68 mmol/L — ABNORMAL HIGH (ref 0.5–2.2)
Lactic Acid, Venous: 4.93 mmol/L — ABNORMAL HIGH (ref 0.5–2.2)
Lactic Acid, Venous: 2.05 mmol/L (ref 0.5–2.2)

## 2014-05-13 LAB — URINE MICROSCOPIC-ADD ON

## 2014-05-13 LAB — CORTISOL: Cortisol, Plasma: >150 ug/dL

## 2014-05-13 LAB — CBG MONITORING, ED: GLUCOSE-CAPILLARY: 140 mg/dL — AB (ref 70–99)

## 2014-05-13 LAB — URINALYSIS, ROUTINE W REFLEX MICROSCOPIC
Glucose, UA: 100 mg/dL — AB
KETONES UR: 15 mg/dL — AB
LEUKOCYTES UA: NEGATIVE
Nitrite: NEGATIVE
Protein, ur: 100 mg/dL — AB
Specific Gravity, Urine: 1.02 (ref 1.005–1.030)
UROBILINOGEN UA: 0.2 mg/dL (ref 0.0–1.0)
pH: 5.5 (ref 5.0–8.0)

## 2014-05-13 LAB — CBC WITH DIFFERENTIAL/PLATELET
BASOS PCT: 0 % (ref 0–1)
Basophils Absolute: 0 10*3/uL (ref 0.0–0.1)
EOS PCT: 0 % (ref 0–5)
Eosinophils Absolute: 0 10*3/uL (ref 0.0–0.7)
HEMATOCRIT: 46.2 % (ref 39.0–52.0)
HEMOGLOBIN: 14.7 g/dL (ref 13.0–17.0)
Lymphocytes Relative: 13 % (ref 12–46)
Lymphs Abs: 3.7 10*3/uL (ref 0.7–4.0)
MCH: 29 pg (ref 26.0–34.0)
MCHC: 31.8 g/dL (ref 30.0–36.0)
MCV: 91.1 fL (ref 78.0–100.0)
MONO ABS: 1.4 10*3/uL — AB (ref 0.1–1.0)
Monocytes Relative: 5 % (ref 3–12)
NEUTROS ABS: 23.5 10*3/uL — AB (ref 1.7–7.7)
Neutrophils Relative %: 82 % — ABNORMAL HIGH (ref 43–77)
Platelets: 218 10*3/uL (ref 150–400)
RBC: 5.07 MIL/uL (ref 4.22–5.81)
RDW: 14.6 % (ref 11.5–15.5)
WBC: 28.6 10*3/uL — ABNORMAL HIGH (ref 4.0–10.5)

## 2014-05-13 LAB — MRSA PCR SCREENING: MRSA by PCR: NEGATIVE

## 2014-05-13 LAB — I-STAT TROPONIN, ED: Troponin i, poc: 0.21 ng/mL (ref 0.00–0.08)

## 2014-05-13 LAB — PROCALCITONIN: Procalcitonin: 0.54 ng/mL

## 2014-05-13 LAB — TROPONIN I: TROPONIN I: 1.15 ng/mL — AB (ref <0.30)

## 2014-05-13 LAB — PRO B NATRIURETIC PEPTIDE: Pro B Natriuretic peptide (BNP): 2851 pg/mL — ABNORMAL HIGH (ref 0–450)

## 2014-05-13 LAB — GLUCOSE, CAPILLARY
Glucose-Capillary: 204 mg/dL — ABNORMAL HIGH (ref 70–99)
Glucose-Capillary: 431 mg/dL — ABNORMAL HIGH (ref 70–99)

## 2014-05-13 MED ORDER — HYDROCORTISONE NA SUCCINATE PF 100 MG IJ SOLR
100.0000 mg | Freq: Once | INTRAMUSCULAR | Status: AC
  Administered 2014-05-13: 100 mg via INTRAVENOUS
  Filled 2014-05-13: qty 2

## 2014-05-13 MED ORDER — HEPARIN SODIUM (PORCINE) 5000 UNIT/ML IJ SOLN
5000.0000 [IU] | Freq: Three times a day (TID) | INTRAMUSCULAR | Status: DC
  Administered 2014-05-13 – 2014-05-15 (×5): 5000 [IU] via SUBCUTANEOUS
  Filled 2014-05-13 (×8): qty 1

## 2014-05-13 MED ORDER — ALUM & MAG HYDROXIDE-SIMETH 200-200-20 MG/5ML PO SUSP: 30.0000 mL | Freq: Four times a day (QID) | ORAL | Status: DC | PRN

## 2014-05-13 MED ORDER — TAMSULOSIN HCL 0.4 MG PO CAPS: 0.4000 mg | ORAL_CAPSULE | Freq: Every day | ORAL | Status: DC

## 2014-05-13 MED ORDER — PANTOPRAZOLE SODIUM 40 MG PO TBEC
40.0000 mg | DELAYED_RELEASE_TABLET | Freq: Every day | ORAL | Status: DC
  Administered 2014-05-14 – 2014-05-15 (×2): 40 mg via ORAL
  Filled 2014-05-13 (×2): qty 1

## 2014-05-13 MED ORDER — PIPERACILLIN-TAZOBACTAM 3.375 G IVPB: 3.3750 g | Freq: Three times a day (TID) | INTRAVENOUS | Status: DC

## 2014-05-13 MED ORDER — SODIUM CHLORIDE 0.9 % IV SOLN
INTRAVENOUS | Status: DC
  Administered 2014-05-13 – 2014-05-14 (×2): via INTRAVENOUS
  Administered 2014-05-15: 125 mL/h via INTRAVENOUS
  Administered 2014-05-15: 05:00:00 via INTRAVENOUS

## 2014-05-13 MED ORDER — TETANUS-DIPHTH-ACELL PERTUSSIS 5-2.5-18.5 LF-MCG/0.5 IM SUSP
0.5000 mL | Freq: Once | INTRAMUSCULAR | Status: AC
  Administered 2014-05-13: 0.5 mL via INTRAMUSCULAR
  Filled 2014-05-13: qty 0.5

## 2014-05-13 MED ORDER — ONDANSETRON HCL 4 MG/2ML IJ SOLN: 4.0000 mg | Freq: Four times a day (QID) | INTRAMUSCULAR | Status: DC | PRN

## 2014-05-13 MED ORDER — ACETAMINOPHEN 325 MG PO TABS: 650.0000 mg | ORAL_TABLET | Freq: Four times a day (QID) | ORAL | Status: DC | PRN

## 2014-05-13 MED ORDER — TRAZODONE HCL 50 MG PO TABS
50.0000 mg | ORAL_TABLET | Freq: Every day | ORAL | Status: DC
  Administered 2014-05-13 – 2014-05-14 (×2): 50 mg via ORAL
  Filled 2014-05-13 (×3): qty 1

## 2014-05-13 MED ORDER — ACETAMINOPHEN 325 MG PO TABS
650.0000 mg | ORAL_TABLET | Freq: Once | ORAL | Status: AC
  Administered 2014-05-13: 650 mg via ORAL
  Filled 2014-05-13: qty 2

## 2014-05-13 MED ORDER — SODIUM CHLORIDE 0.9 % IV BOLUS (SEPSIS)
500.0000 mL | Freq: Once | INTRAVENOUS | Status: AC
  Administered 2014-05-13: 500 mL via INTRAVENOUS

## 2014-05-13 MED ORDER — SODIUM CHLORIDE 0.9 % IJ SOLN
3.0000 mL | Freq: Two times a day (BID) | INTRAMUSCULAR | Status: DC
  Administered 2014-05-13: 3 mL via INTRAVENOUS

## 2014-05-13 MED ORDER — PRAVASTATIN SODIUM 40 MG PO TABS
40.0000 mg | ORAL_TABLET | Freq: Every day | ORAL | Status: DC
  Administered 2014-05-13 – 2014-05-15 (×3): 40 mg via ORAL
  Filled 2014-05-13 (×3): qty 1

## 2014-05-13 MED ORDER — INSULIN ASPART 100 UNIT/ML ~~LOC~~ SOLN
0.0000 [IU] | Freq: Three times a day (TID) | SUBCUTANEOUS | Status: DC
  Administered 2014-05-14: 5 [IU] via SUBCUTANEOUS

## 2014-05-13 MED ORDER — BUDESONIDE-FORMOTEROL FUMARATE 160-4.5 MCG/ACT IN AERO
2.0000 | INHALATION_SPRAY | Freq: Two times a day (BID) | RESPIRATORY_TRACT | Status: DC
  Administered 2014-05-13 – 2014-05-15 (×5): 2 via RESPIRATORY_TRACT
  Filled 2014-05-13: qty 6

## 2014-05-13 MED ORDER — SODIUM CHLORIDE 0.9 % IV BOLUS (SEPSIS)
1000.0000 mL | Freq: Once | INTRAVENOUS | Status: AC
  Administered 2014-05-13: 1000 mL via INTRAVENOUS

## 2014-05-13 MED ORDER — HYDROCODONE-ACETAMINOPHEN 5-325 MG PO TABS
1.0000 | ORAL_TABLET | Freq: Four times a day (QID) | ORAL | Status: DC | PRN
  Administered 2014-05-15: 1 via ORAL
  Filled 2014-05-13: qty 1

## 2014-05-13 MED ORDER — IPRATROPIUM-ALBUTEROL 0.5-2.5 (3) MG/3ML IN SOLN
3.0000 mL | Freq: Four times a day (QID) | RESPIRATORY_TRACT | Status: DC
  Administered 2014-05-13 – 2014-05-14 (×2): 3 mL via RESPIRATORY_TRACT
  Filled 2014-05-13 (×2): qty 3

## 2014-05-13 MED ORDER — HYDROCORTISONE NA SUCCINATE PF 100 MG IJ SOLR
50.0000 mg | Freq: Three times a day (TID) | INTRAMUSCULAR | Status: DC
  Administered 2014-05-13 – 2014-05-15 (×7): 50 mg via INTRAVENOUS
  Filled 2014-05-13 (×8): qty 1

## 2014-05-13 MED ORDER — ONDANSETRON HCL 4 MG PO TABS: 4.0000 mg | ORAL_TABLET | Freq: Four times a day (QID) | ORAL | Status: DC | PRN

## 2014-05-13 MED ORDER — SODIUM CHLORIDE 0.9 % IV SOLN
3.0000 g | Freq: Two times a day (BID) | INTRAVENOUS | Status: DC
  Administered 2014-05-13 – 2014-05-15 (×5): 3 g via INTRAVENOUS
  Filled 2014-05-13 (×6): qty 3

## 2014-05-13 MED ORDER — ACETAMINOPHEN 650 MG RE SUPP: 650.0000 mg | Freq: Four times a day (QID) | RECTAL | Status: DC | PRN

## 2014-05-13 MED ORDER — INSULIN ASPART 100 UNIT/ML ~~LOC~~ SOLN
8.0000 [IU] | Freq: Once | SUBCUTANEOUS | Status: AC
  Administered 2014-05-13: 8 [IU] via SUBCUTANEOUS

## 2014-05-13 MED ORDER — ESOMEPRAZOLE MAGNESIUM 40 MG PO PACK: 40.0000 mg | PACK | Freq: Two times a day (BID) | ORAL | Status: DC

## 2014-05-13 MED ORDER — VANCOMYCIN HCL IN DEXTROSE 1-5 GM/200ML-% IV SOLN
1000.0000 mg | INTRAVENOUS | Status: DC
  Administered 2014-05-14 – 2014-05-15 (×2): 1000 mg via INTRAVENOUS
  Filled 2014-05-13 (×3): qty 200

## 2014-05-13 MED ORDER — PIPERACILLIN-TAZOBACTAM 3.375 G IVPB 30 MIN
3.3750 g | Freq: Once | INTRAVENOUS | Status: AC
Start: 2014-05-13 — End: 2014-05-13
  Administered 2014-05-13: 3.375 g via INTRAVENOUS
  Filled 2014-05-13: qty 50

## 2014-05-13 MED ORDER — INSULIN ASPART 100 UNIT/ML ~~LOC~~ SOLN: 0.0000 [IU] | Freq: Every day | SUBCUTANEOUS | Status: DC

## 2014-05-13 MED ORDER — VANCOMYCIN HCL 10 G IV SOLR
1500.0000 mg | Freq: Once | INTRAVENOUS | Status: AC
  Administered 2014-05-13: 1500 mg via INTRAVENOUS
  Filled 2014-05-13: qty 1500

## 2014-05-13 MED ORDER — HYDROMORPHONE HCL 1 MG/ML IJ SOLN: 1.0000 mg | INTRAMUSCULAR | Status: DC | PRN

## 2014-05-13 NOTE — ED Provider Notes (Signed)
CSN: [id number]     Arrival date & time 05/23/2014  1219 History   First MD Initiated Contact with Patient 05/28/2014 1229     Chief Complaint  Patient presents with  . Cellulitis     (Consider location/radiation/quality/duration/timing/severity/associated sxs/prior Treatment) Patient is a 80 y.o. male presenting with weakness. The history is provided by the patient (the pt complains of weakness and states he was bit by a cat on the left arm and now has a large rash to the arm).  Weakness This is a new problem. The current episode started yesterday. The problem occurs constantly. The problem has not changed since onset.Associated symptoms include shortness of breath. Pertinent negatives include no chest pain, no abdominal pain and no headaches. Nothing aggravates the symptoms. Nothing relieves the symptoms.    Past Medical History  Diagnosis Date  . Cancer     skin  . Diabetes mellitus without complication   . Asthma   . COPD (chronic obstructive pulmonary disease)   . Hypertension   . GERD (gastroesophageal reflux disease)   . BOOP (bronchiolitis obliterans with organizing pneumonia)   . Pulmonary nodules   . Dyspnea   . Chronic respiratory failure    History reviewed. No pertinent past surgical history. No family history on file. History  Substance Use Topics  . Smoking status: Former Smoker -- 1.00 packs/day for 50 years    Types: Cigarettes    Quit date: 06/26/1996  . Smokeless tobacco: Former Systems developer    Types: Chew    Quit date: 06/12/2008  . Alcohol Use: Yes    Review of Systems  Constitutional: Negative for appetite change and fatigue.  HENT: Negative for congestion, ear discharge and sinus pressure.   Eyes: Negative for discharge.  Respiratory: Positive for shortness of breath. Negative for cough.   Cardiovascular: Negative for chest pain.  Gastrointestinal: Negative for abdominal pain and diarrhea.  Genitourinary: Negative for frequency and hematuria.   Musculoskeletal: Negative for back pain.  Skin: Negative for rash.  Neurological: Positive for weakness. Negative for seizures and headaches.  Psychiatric/Behavioral: Negative for hallucinations.      Allergies  Cefaclor  Home Medications   Prior to Admission medications   Medication Sig Start Date End Date Taking? Authorizing Provider  allopurinol (ZYLOPRIM) 100 MG tablet Take 200 mg by mouth daily.    Historical Provider, MD  budesonide-formoterol (SYMBICORT) 160-4.5 MCG/ACT inhaler Inhale 2 puffs into the lungs 2 (two) times daily.     Historical Provider, MD  COMBIVENT RESPIMAT 20-100 MCG/ACT AERS respimat 1 puff every 6 (six) hours as needed.     Historical Provider, MD  diazepam (VALIUM) 5 MG tablet Take by mouth daily. 2.5 mg daily    Historical Provider, MD  esomeprazole (NEXIUM) 40 MG packet Take 40 mg by mouth daily before breakfast. [id number] 11/04/13   Collene Gobble, MD  insulin NPH (HUMULIN N,NOVOLIN N) 100 UNIT/ML injection Inject into the skin. 20 units    Historical Provider, MD  insulin regular (NOVOLIN R,HUMULIN R) 100 units/mL injection Inject into the skin 2 (two) times daily before a meal. 10 units    Historical Provider, MD  lisinopril (PRINIVIL,ZESTRIL) 40 MG tablet Take 40 mg by mouth daily.    Historical Provider, MD  metoprolol succinate (TOPROL-XL) 50 MG 24 hr tablet Take 50 mg by mouth daily. 1/2 pill in AM (25mg ) and 1/2 pill in PM (25mg )    Historical Provider, MD  pravastatin (PRAVACHOL) 40 MG tablet Take 40 mg by  mouth daily.    Historical Provider, MD  predniSONE (DELTASONE) 10 MG tablet Take 10 mg by mouth daily with breakfast.    Historical Provider, MD  predniSONE (DELTASONE) 10 MG tablet Take 2 tablets x 2 weeks; then 1 1/2 tablets daily 04/14/14   Collene Gobble, MD  Tamsulosin HCl (FLOMAX) 0.4 MG CAPS Take 0.4 mg by mouth daily.    Historical Provider, MD  tiotropium (SPIRIVA) 18 MCG inhalation capsule Place 18 mcg into inhaler and inhale daily.     Historical Provider, MD  Tiotropium Bromide Monohydrate (SPIRIVA RESPIMAT) 2.5 MCG/ACT AERS Inhale 2 puffs into the lungs daily. 04/14/14   Collene Gobble, MD  traZODone (DESYREL) 50 MG tablet Take 50 mg by mouth at bedtime.    Historical Provider, MD   BP 109/59 mmHg  Pulse 134  Temp(Src) 98.2 F (36.8 C) (Oral)  Resp 32  Ht 5\' 9"  (1.753 m)  Wt 200 lb (90.719 kg)  BMI 29.52 kg/m2  SpO2 93% Physical Exam  Constitutional: He is oriented to person, place, and time. He appears well-developed.  HENT:  Head: Normocephalic.  Eyes: Conjunctivae and EOM are normal. No scleral icterus.  Neck: Neck supple. No thyromegaly present.  Cardiovascular: Exam reveals no gallop and no friction rub.   No murmur heard. Irregular rapid rate  Pulmonary/Chest: No stridor. He has wheezes. He has no rales. He exhibits no tenderness.  Abdominal: He exhibits no distension. There is no tenderness. There is no rebound.  Musculoskeletal: Normal range of motion. He exhibits no edema.  Lymphadenopathy:    He has no cervical adenopathy.  Neurological: He is oriented to person, place, and time. He exhibits normal muscle tone. Coordination normal.  Skin: No rash noted. No erythema.  Psychiatric: He has a normal mood and affect. His behavior is normal.    ED Course  Procedures (including critical care time) Labs Review Labs Reviewed  CBC WITH DIFFERENTIAL - Abnormal; Notable for the following:    WBC 28.6 (*)    Neutrophils Relative % 82 (*)    Neutro Abs 23.5 (*)    Monocytes Absolute 1.4 (*)    All other components within normal limits  COMPREHENSIVE METABOLIC PANEL - Abnormal; Notable for the following:    Glucose, Bld 142 (*)    BUN 27 (*)    Creatinine, Ser 2.41 (*)    Albumin 3.0 (*)    GFR calc non Af Amer 23 (*)    GFR calc Af Amer 27 (*)    Anion gap 18 (*)    All other components within normal limits  URINALYSIS, ROUTINE W REFLEX MICROSCOPIC - Abnormal; Notable for the following:    Color,  Urine AMBER (*)    APPearance CLOUDY (*)    Glucose, UA 100 (*)    Hgb urine dipstick LARGE (*)    Bilirubin Urine SMALL (*)    Ketones, ur 15 (*)    Protein, ur 100 (*)    All other components within normal limits  PRO B NATRIURETIC PEPTIDE - Abnormal; Notable for the following:    Pro B Natriuretic peptide (BNP) 2851.0 (*)    All other components within normal limits  URINE MICROSCOPIC-ADD ON - Abnormal; Notable for the following:    Squamous Epithelial / LPF FEW (*)    Bacteria, UA FEW (*)    All other components within normal limits  I-STAT CG4 LACTIC ACID, ED - Abnormal; Notable for the following:    Lactic Acid, Venous  4.93 (*)    All other components within normal limits  CBG MONITORING, ED - Abnormal; Notable for the following:    Glucose-Capillary 140 (*)    All other components within normal limits  CULTURE, BLOOD (ROUTINE X 2)  CULTURE, BLOOD (ROUTINE X 2)  URINE CULTURE  CULTURE, BLOOD (SINGLE)  PROCALCITONIN  I-STAT TROPOININ, ED  I-STAT CG4 LACTIC ACID, ED    Imaging Review Dg Chest Port 1 View  05/16/2014   CLINICAL DATA:  Fever. Shortness of breath and tachycardia. ICD10: R 06.02  EXAM: PORTABLE CHEST - 1 VIEW  COMPARISON:  09/23/2013 chest radiograph.  03/11/2014 CT.  FINDINGS: Apical lordotic positioning. Midline trachea. Cardiomegaly accentuated by AP portable technique. Mediastinal contours otherwise within normal limits. No pleural effusion or pneumothorax. Clear lungs.  IMPRESSION: Cardiomegaly without congestive failure.   Electronically Signed   By: Abigail Miyamoto M.D.   On: 06/10/2014 13:54     EKG Interpretation   Date/Time:  Wednesday May 13 2014 12:26:52 EST Ventricular Rate:  140 PR Interval:  141 QRS Duration: 106 QT Interval:  299 QTC Calculation: 456 R Axis:   -129 Text Interpretation:  Supraventricular tachycardia Multiple premature  complexes, vent  Right ventricular hypertrophy Inferior infarct, old  Baseline wander in lead(s) V4  Confirmed by Takuya Lariccia  MD, Yula Crotwell [phone]) on  05/20/2014 12:46:53 PM     CRITICAL CARE Performed by: Brallan Denio L Total critical care time:40 Critical care time was exclusive of separately billable procedures and treating other patients. Critical care was necessary to treat or prevent imminent or life-threatening deterioration. Critical care was time spent personally by me on the following activities: development of treatment plan with patient and/or surrogate as well as nursing, discussions with consultants, evaluation of patient's response to treatment, examination of patient, obtaining history from patient or surrogate, ordering and performing treatments and interventions, ordering and review of laboratory studies, ordering and review of radiographic studies, pulse oximetry and re-evaluation of patient's condition.   MDM   Final diagnoses:  SOB (shortness of breath)  Sepsis affecting skin    Pt with sepsis,   Critical care saw pt and the pt did not want aggressive tx.  He is admitted to medicine for tx of sepsis  The chart was scribed for me under my direct supervision.  I personally performed the history, physical, and medical decision making and all procedures in the evaluation of this patient.Maudry Diego, MD 06/07/2014 [phone]

## 2014-05-13 NOTE — ED Notes (Signed)
Admit Doctor at bedside.  

## 2014-05-13 NOTE — Telephone Encounter (Signed)
Left message for daughter to let her know we are aware of pt going to ED and to call back if needed.  Will forward to RB to make aware.

## 2014-05-13 NOTE — ED Notes (Signed)
X-ray at bedside

## 2014-05-13 NOTE — Plan of Care (Signed)
Problem: Phase I Progression Outcomes Goal: Antibiotics started within 4 hours of arrival Outcome: Completed/Met Date Met:  05/17/2014

## 2014-05-13 NOTE — Telephone Encounter (Signed)
Pt's daughter returned call - [phone]

## 2014-05-13 NOTE — Plan of Care (Signed)
Problem: Phase I Progression Outcomes Goal: IV fluids as ordered Outcome: Completed/Met Date Met:  06/09/2014

## 2014-05-13 NOTE — Consult Note (Signed)
Name: Albert Hansen MRN: [id number] DOB: 07-02-1929    ADMISSION DATE:  05/14/2014 CONSULTATION DATE:  12/2  REFERRING MD :  EDP  CHIEF COMPLAINT:  Sepsis  BRIEF PATIENT DESCRIPTION: 80yo male with hx COPD on 4L [place] home O2, BOOP, (followed by RB), HTN, DM, skin cancer presented 12/2 to ER with 3-4 day hx fever, malaise.  ??If fever started after cat bite to L hand. Was hypotensive in ER, PCCM consulted.   SIGNIFICANT EVENTS    STUDIES:     HISTORY OF PRESENT ILLNESS: 80yo male with hx COPD on 4L [place] home O2, BOOP, (followed by RB), HTN, DM, skin cancer presented 12/2 to ER with 3-4 day hx fever, malaise.  ??If fever started after cat bite to L hand. Was hypotensive in ER, PCCM consulted.  Per wife has been more SOB than baseline.  Denies cough, chest pain, purulent sputum, hemoptysis.    PAST MEDICAL HISTORY :   has a past medical history of Cancer; Diabetes mellitus without complication; Asthma; COPD (chronic obstructive pulmonary disease); Hypertension; GERD (gastroesophageal reflux disease); BOOP (bronchiolitis obliterans with organizing pneumonia); Pulmonary nodules; Dyspnea; and Chronic respiratory failure.  has no past surgical history on file. Prior to Admission medications   Medication Sig Start Date End Date Taking? Authorizing Provider  allopurinol (ZYLOPRIM) 100 MG tablet Take 200 mg by mouth daily.    Historical Provider, MD  budesonide-formoterol (SYMBICORT) 160-4.5 MCG/ACT inhaler Inhale 2 puffs into the lungs 2 (two) times daily.     Historical Provider, MD  COMBIVENT RESPIMAT 20-100 MCG/ACT AERS respimat 1 puff every 6 (six) hours as needed.     Historical Provider, MD  diazepam (VALIUM) 5 MG tablet Take by mouth daily. 2.5 mg daily    Historical Provider, MD  esomeprazole (NEXIUM) 40 MG packet Take 40 mg by mouth daily before breakfast. [id number] 11/04/13   Collene Gobble, MD  insulin NPH (HUMULIN N,NOVOLIN N) 100 UNIT/ML injection Inject into the skin. 20 units     Historical Provider, MD  insulin regular (NOVOLIN R,HUMULIN R) 100 units/mL injection Inject into the skin 2 (two) times daily before a meal. 10 units    Historical Provider, MD  lisinopril (PRINIVIL,ZESTRIL) 40 MG tablet Take 40 mg by mouth daily.    Historical Provider, MD  metoprolol succinate (TOPROL-XL) 50 MG 24 hr tablet Take 50 mg by mouth daily. 1/2 pill in AM (25mg ) and 1/2 pill in PM (25mg )    Historical Provider, MD  pravastatin (PRAVACHOL) 40 MG tablet Take 40 mg by mouth daily.    Historical Provider, MD  predniSONE (DELTASONE) 10 MG tablet Take 10 mg by mouth daily with breakfast.    Historical Provider, MD  predniSONE (DELTASONE) 10 MG tablet Take 2 tablets x 2 weeks; then 1 1/2 tablets daily 04/14/14   Collene Gobble, MD  Tamsulosin HCl (FLOMAX) 0.4 MG CAPS Take 0.4 mg by mouth daily.    Historical Provider, MD  tiotropium (SPIRIVA) 18 MCG inhalation capsule Place 18 mcg into inhaler and inhale daily.    Historical Provider, MD  Tiotropium Bromide Monohydrate (SPIRIVA RESPIMAT) 2.5 MCG/ACT AERS Inhale 2 puffs into the lungs daily. 04/14/14   Collene Gobble, MD  traZODone (DESYREL) 50 MG tablet Take 50 mg by mouth at bedtime.    Historical Provider, MD   Allergies  Allergen Reactions  . Cefaclor     FAMILY HISTORY:  family history is not on file. SOCIAL HISTORY:  reports that he quit smoking  about 17 years ago. His smoking use included Cigarettes. He has a 50 pack-year smoking history. He quit smokeless tobacco use about 5 years ago. His smokeless tobacco use included Chew. He reports that he drinks alcohol. He reports that he does not use illicit drugs.  REVIEW OF SYSTEMS:   As per HPI - All other systems reviewed and were neg.    SUBJECTIVE:   VITAL SIGNS: Temp:  [98.2 F (36.8 C)] 98.2 F (36.8 C) (12/02 1233) Pulse Rate:  [144] 144 (12/02 1233) Resp:  [30] 30 (12/02 1233) BP: (85)/(58) 85/58 mmHg (12/02 1233) SpO2:  [92 %] 92 % (12/02 1233) Weight:  [200 lb  (90.719 kg)] 200 lb (90.719 kg) (12/02 1233)  PHYSICAL EXAMINATION: General:  Pleasant, chronically ill appearing male, NAD  Neuro:  Awake, alert, MAE, appropriate HEENT:  Mm dry, no JVD Cardiovascular:  s1s2 rrr, mild tachy Lungs:  resps even non labored on 4L [place], diminished, few exp wheeze Abdomen:  Soft, +bs Musculoskeletal:  Discolored, erythematous, scaly, L hand minimal swelling, no drainage   No results for input(s): NA, K, CL, CO2, BUN, CREATININE, GLUCOSE in the last 168 hours. No results for input(s): HGB, HCT, WBC, PLT in the last 168 hours. No results found.  ASSESSMENT / PLAN:  Sepsis - Cellulitis v other source. L hand ?cat bite does not appear purulent.  COPD, Hx BOOP  All labs still pending.   REC -  Pan culture  Stat CXR  Needs further volume resuscitation  Broad spectrum abx - consider addition doxy for ?cat scratch fever but doubt this is driving sepsis  Supplemental O2  BD's  Stat lactate, pct  No indication systemic steroids as no evidence AECOPD  Wound consult  F/u all pending labs    Discussed with pt at length at bedside.  He feels that he has been declining overall, especially from a respiratory standpoint.  He wants to continue all aggressive medical care but if he deteriorated would not want intubation, CPR, defib, pressors.  Will make full DNR, cont full medical care and ask [facility] to admit.    PCCM signing off please call back if needed.   Nickolas Madrid, NP 05/14/2014  1:28 PM Pager: [phone] or [phone]  Source of infection unclear but clearly in septic shock.  Suspect cellulitis.  Patient is very clear that he does not wish for aggressive measures.  He is ok with treatment with fluid and abx but no CPR/cardioversion/ETT or pressors.  Therefore, ok to admit to SDU.  Fluid resuscitation.  F/U on labs.  Vanc/zosyn (recent hospitalization).  [place] for [facility] to admit.  PCCM will sign off, please call back if needed.  Patient seen and  examined, agree with above note.  I dictated the care and orders written for this patient under my direction.  Rush Farmer, MD [phone]

## 2014-05-13 NOTE — ED Notes (Signed)
Blood culture reordered due to EPIC expired.  Blood cultured drawn at 1325

## 2014-05-13 NOTE — ED Notes (Signed)
Roderic Palau, MD aware of abnormal lab test results

## 2014-05-13 NOTE — ED Notes (Signed)
Spoke with EDP about ordering antibiotics.

## 2014-05-13 NOTE — Consult Note (Addendum)
[facility]  Date of Admission:  05/20/2014  Date of Consult:  05/19/2014  Reason for Consult: Sepsis, Cellulitis, Cat bite Referring Physician: Rai  Impression/Recommendation Sepsis Cat bite Cellulitis + troponin Immunosuppression DM2 CRI  Low threshold to have hand surgery eval pt if is his erythema is unchanged or progresses.  He needs Tetanus shot Will f/u BCx Continue his current anbx Appreciate CCM eval.   Comment-  Pt is immunosuppressed from multiple angels- DM, steroid use, CRI/protein wasting, damaged skin. Cat bites can be very aggressive due to the depth of the puncture and "dirty" source. Commonly see Strep, Staph, Eichenella, Pasturella, other gram negatives. Fortunately cat is captive and he can avoid rabies series.   Thank you so much for this truly interesting consult,   [name] (pager) [phone] [email or web address]  Albert Hansen is an 80 y.o. male.  HPI: 80 yo M with hx of CRI, DM, home O2 for BOOP/COPD, was given steroid taper roughly 6 weeks ago. He is currently at 21m/day. He also has a hx of skin cancer and has been applying a topical anti-neoplastic to his arms recently.  He was bitten 3 times by his daughters cat (indoor, not ill, observed, shots up to date, provoked) ~ 11-28. He felt like one of the bites "hit the bone" and "got stuck in it". Over last 48 hours his L arm has become more erythematous and swollen. He had high fever and sweats last pm. He was hypoglycemic this AM (48).  He was brought to ED and was tachycardic, hypotensive.  He denies allergies.  Per his family he is at his baseline mental status.  He drinks a beer weekly, denies hx of DT.  He denies hx of liver or splenic surgery.   Past Medical History  Diagnosis Date  . Cancer     skin  . Diabetes mellitus without complication   . Asthma   . COPD (chronic obstructive pulmonary disease)   . Hypertension   . GERD (gastroesophageal reflux  disease)   . BOOP (bronchiolitis obliterans with organizing pneumonia)   . Pulmonary nodules   . Dyspnea   . Chronic respiratory failure     History reviewed. No pertinent past surgical history.   Allergies  Allergen Reactions  . Cefaclor Other (See Comments)    Skin crawling     Medications:  Scheduled: . ampicillin-sulbactam (UNASYN) IV  3 g Intravenous Q12H  . budesonide-formoterol  2 puff Inhalation BID  . heparin  5,000 Units Subcutaneous 3 times per day  . hydrocortisone sod succinate (SOLU-CORTEF) inj  50 mg Intravenous Q8H  . [START ON 05/14/2014] insulin aspart  0-15 Units Subcutaneous TID WC  . insulin aspart  0-5 Units Subcutaneous QHS  . ipratropium-albuterol  3 mL Nebulization QID  . pantoprazole  40 mg Oral Daily  . pravastatin  40 mg Oral Daily  . sodium chloride  3 mL Intravenous Q12H  . traZODone  50 mg Oral QHS  . [START ON 05/14/2014] vancomycin  1,000 mg Intravenous Q24H    Abtx:  Anti-infectives    Start     Dose/Rate Route Frequency Ordered Stop   05/14/14 1400  vancomycin (VANCOCIN) IVPB 1000 mg/200 mL premix     1,000 mg200 mL/hr over 60 Minutes Intravenous Every 24 hours 05/18/2014 1419     06/02/2014 2000  piperacillin-tazobactam (ZOSYN) IVPB 3.375 g  Status:  Discontinued     3.375 g12.5 mL/hr over 240 Minutes Intravenous Every 8 hours  06/01/2014 1419 05/14/2014 1451   05/12/2014 2000  Ampicillin-Sulbactam (UNASYN) 3 g in sodium chloride 0.9 % 100 mL IVPB     3 g100 mL/hr over 60 Minutes Intravenous Every 12 hours 05/31/2014 1507     06/08/2014 1400  piperacillin-tazobactam (ZOSYN) IVPB 3.375 g     3.375 g100 mL/hr over 30 Minutes Intravenous  Once 05/29/2014 1323 06/08/2014 1420   06/11/2014 1330  vancomycin (VANCOCIN) 1,500 mg in sodium chloride 0.9 % 500 mL IVPB     1,500 mg250 mL/hr over 120 Minutes Intravenous  Once 06/02/2014 1323 05/24/2014 1614      Total days of antibiotics 0- vanco/unsayn          Social History:  reports that he quit smoking about 17  years ago. His smoking use included Cigarettes. He has a 50 pack-year smoking history. He quit smokeless tobacco use about 5 years ago. His smokeless tobacco use included Chew. He reports that he drinks alcohol. He reports that he does not use illicit drugs.  No family history on file.  General ROS: see above, urinary hesitancy, normal BM.   Blood pressure 133/74, pulse 116, temperature 98 F (36.7 C), temperature source Oral, resp. rate 25, height _0  (1.753 m), weight 92.4 kg (203 lb 11.3 oz), SpO2 98 %. General appearance: alert, cooperative, flushed and no distress Eyes: negative findings: pupils equal, round, reactive to light and accomodation Throat: normal findings: oropharynx pink & moist without lesions or evidence of thrush Neck: no adenopathy and supple, symmetrical, trachea midline Lungs: clear to auscultation bilaterally Heart: regular rate and rhythm Abdomen: normal findings: bowel sounds normal, soft, non-tender and ditended. no anterior scars. Extremities: LUE is erythematous to shoulder. there are 3 punture wounds, scabs on L wrist. there is mild tenderness on the volar aspect. there is no fluctuance.  Skin: multiple scarred and crusted areas. there is a raised scar like area on his L upper chest.    Results for orders placed or performed during the hospital encounter of 05/18/2014 (from the past 48 hour(s))  CBC WITH DIFFERENTIAL     Status: Abnormal   Collection Time: 05/12/2014 12:37 PM  Result Value Ref Range   WBC 28.6 (H) 4.0 - 10.5 K/uL   RBC 5.07 4.22 - 5.81 MIL/uL   Hemoglobin 14.7 13.0 - 17.0 g/dL   HCT 46.2 39.0 - 52.0 %   MCV 91.1 78.0 - 100.0 fL   MCH 29.0 26.0 - 34.0 pg   MCHC 31.8 30.0 - 36.0 g/dL   RDW 14.6 11.5 - 15.5 %   Platelets 218 150 - 400 K/uL   Neutrophils Relative % 82 (H) 43 - 77 %   Lymphocytes Relative 13 12 - 46 %   Monocytes Relative 5 3 - 12 %   Eosinophils Relative 0 0 - 5 %   Basophils Relative 0 0 - 1 %   Neutro Abs 23.5 (H) 1.7 -  7.7 K/uL   Lymphs Abs 3.7 0.7 - 4.0 K/uL   Monocytes Absolute 1.4 (H) 0.1 - 1.0 K/uL   Eosinophils Absolute 0.0 0.0 - 0.7 K/uL   Basophils Absolute 0.0 0.0 - 0.1 K/uL   WBC Morphology TOXIC GRANULATION   Comprehensive metabolic panel     Status: Abnormal   Collection Time: 05/19/2014 12:37 PM  Result Value Ref Range   Sodium 143 137 - 147 mEq/L   Potassium 4.7 3.7 - 5.3 mEq/L   Chloride 100 96 - 112 mEq/L   CO2 25 19 -  32 mEq/L   Glucose, Bld 142 (H) 70 - 99 mg/dL   BUN 27 (H) 6 - 23 mg/dL   Creatinine, Ser 2.41 (H) 0.50 - 1.35 mg/dL   Calcium 9.0 8.4 - 10.5 mg/dL   Total Protein 6.4 6.0 - 8.3 g/dL   Albumin 3.0 (L) 3.5 - 5.2 g/dL   AST 16 0 - 37 U/L   ALT 10 0 - 53 U/L   Alkaline Phosphatase 51 39 - 117 U/L   Total Bilirubin 0.8 0.3 - 1.2 mg/dL   GFR calc non Af Amer 23 (L) >90 mL/min   GFR calc Af Amer 27 (L) >90 mL/min    Comment: (NOTE) The eGFR has been calculated using the CKD EPI equation. This calculation has not been validated in all clinical situations. eGFR's persistently <90 mL/min signify possible Chronic Kidney Disease.    Anion gap 18 (H) 5 - 15  CBG monitoring, ED     Status: Abnormal   Collection Time: 05/19/2014 12:39 PM  Result Value Ref Range   Glucose-Capillary 140 (H) 70 - 99 mg/dL  Pro b natriuretic peptide (BNP)     Status: Abnormal   Collection Time: 06/11/2014 12:40 PM  Result Value Ref Range   Pro B Natriuretic peptide (BNP) 2851.0 (H) 0 - 450 pg/mL  Procalcitonin     Status: None   Collection Time: 05/27/2014 12:40 PM  Result Value Ref Range   Procalcitonin 0.54 ng/mL    Comment:        Interpretation: PCT > 0.5 ng/mL and <= 2 ng/mL: Systemic infection (sepsis) is possible, but other conditions are known to elevate PCT as well. (NOTE)         ICU PCT Algorithm               Non ICU PCT Algorithm    ----------------------------     ------------------------------         PCT < 0.25 ng/mL                 PCT < 0.1 ng/mL     Stopping of  antibiotics            Stopping of antibiotics       strongly encouraged.               strongly encouraged.    ----------------------------     ------------------------------       PCT level decrease by               PCT < 0.25 ng/mL       >= 80% from peak PCT       OR PCT 0.25 - 0.5 ng/mL          Stopping of antibiotics                                             encouraged.     Stopping of antibiotics           encouraged.    ----------------------------     ------------------------------       PCT level decrease by              PCT >= 0.25 ng/mL       < 80% from peak PCT        AND PCT >= 0.5 ng/mL  Continuing antibiotics                                              encouraged.       Continuing antibiotics            encouraged.    ----------------------------     ------------------------------     PCT level increase compared          PCT > 0.5 ng/mL         with peak PCT AND          PCT >= 0.5 ng/mL             Escalation of antibiotics                                          strongly encouraged.      Escalation of antibiotics        strongly encouraged.   Urinalysis, Routine w reflex microscopic     Status: Abnormal   Collection Time: 05/16/2014  1:14 PM  Result Value Ref Range   Color, Urine AMBER (A) YELLOW    Comment: BIOCHEMICALS MAY BE AFFECTED BY COLOR   APPearance CLOUDY (A) CLEAR   Specific Gravity, Urine 1.020 1.005 - 1.030   pH 5.5 5.0 - 8.0   Glucose, UA 100 (A) NEGATIVE mg/dL   Hgb urine dipstick LARGE (A) NEGATIVE   Bilirubin Urine SMALL (A) NEGATIVE   Ketones, ur 15 (A) NEGATIVE mg/dL   Protein, ur 100 (A) NEGATIVE mg/dL   Urobilinogen, UA 0.2 0.0 - 1.0 mg/dL   Nitrite NEGATIVE NEGATIVE   Leukocytes, UA NEGATIVE NEGATIVE  Urine microscopic-add on     Status: Abnormal   Collection Time: 05/23/2014  1:14 PM  Result Value Ref Range   Squamous Epithelial / LPF FEW (A) RARE   WBC, UA 0-2 <3 WBC/hpf   RBC / HPF TOO NUMEROUS TO COUNT <3 RBC/hpf    Bacteria, UA FEW (A) RARE   Urine-Other AMORPHOUS URATES/PHOSPHATES     Comment: MUCOUS PRESENT  I-Stat CG4 Lactic Acid, ED     Status: Abnormal   Collection Time: 05/28/2014  1:37 PM  Result Value Ref Range   Lactic Acid, Venous 4.93 (H) 0.5 - 2.2 mmol/L  I-stat troponin, ED (not at [facility])     Status: Abnormal   Collection Time: 05/23/2014  2:23 PM  Result Value Ref Range   Troponin i, poc 0.21 (HH) 0.00 - 0.08 ng/mL   Comment NOTIFIED PHYSICIAN    Comment 3            Comment: Due to the release kinetics of cTnI, a negative result within the first hours of the onset of symptoms does not rule out myocardial infarction with certainty. If myocardial infarction is still suspected, repeat the test at appropriate intervals.   I-Stat CG4 Lactic Acid, ED     Status: Abnormal   Collection Time: 06/05/2014  2:26 PM  Result Value Ref Range   Lactic Acid, Venous 2.68 (H) 0.5 - 2.2 mmol/L  I-Stat CG4 Lactic Acid, ED     Status: None   Collection Time: 05/28/2014  3:49 PM  Result Value Ref Range   Lactic Acid, Venous 2.05 0.5 - 2.2 mmol/L  Glucose,  capillary     Status: Abnormal   Collection Time: 05/12/2014  5:22 PM  Result Value Ref Range   Glucose-Capillary 204 (H) 70 - 99 mg/dL   No results found for: SDES, SPECREQUEST, CULT, REPTSTATUS Dg Chest Port 1 View  05/19/2014   CLINICAL DATA:  Fever. Shortness of breath and tachycardia. ICD10: R 06.02  EXAM: PORTABLE CHEST - 1 VIEW  COMPARISON:  09/23/2013 chest radiograph.  03/11/2014 CT.  FINDINGS: Apical lordotic positioning. Midline trachea. Cardiomegaly accentuated by AP portable technique. Mediastinal contours otherwise within normal limits. No pleural effusion or pneumothorax. Clear lungs.  IMPRESSION: Cardiomegaly without congestive failure.   Electronically Signed   By: Abigail Miyamoto M.D.   On: 05/27/2014 13:54   Dg Hand Complete Left  05/12/2014   CLINICAL DATA:  Scratched and hit by a cat 4-5 days ago. Pain at the base of the first metacarpal.   EXAM: LEFT HAND - COMPLETE 3+ VIEW  COMPARISON:  None.  FINDINGS: The joint spaces are maintained. No acute bony findings or radiopaque foreign body. Mild DIP and PIP joint degenerative changes and mild degenerative changes at the carpal metacarpal joint of the thumb.  IMPRESSION: No acute bony findings or radiopaque foreign body.   Electronically Signed   By: Kalman Jewels M.D.   On: 06/09/2014 16:29   No results found for this or any previous visit (from the past 240 hour(s)).    05/24/2014, 5:50 PM     LOS: 0 days

## 2014-05-13 NOTE — ED Notes (Signed)
Per [facility], pt has generalized skin cancer and applied topical cream 5-6 days ago. Bilateral arm swelling, right is no longer swollen but left is swollen and red. Also was bit by his cat 3 days ago on the left hand. Hx of emphysema and has had increased SOB last night and chills. On home O2 at 4.5 liters. Sat this morning was 92 % and wife states that is normal. Received 5 mg Albuterol by EMS. Was given morning medications but no insulin. CBG at home was 32 and wife gave honeybun but has not been rechecked. Pt is alert and oriented.

## 2014-05-13 NOTE — ED Notes (Signed)
Pharmacist at bedside to verify allergies with patient.  Patient unsure of Cefaclor allergy stated it was 30 years ago and never proven.

## 2014-05-13 NOTE — ED Notes (Signed)
Critical care to order antibiotics.

## 2014-05-13 NOTE — ED Notes (Signed)
Critical Care at bedside.  

## 2014-05-13 NOTE — H&P (Signed)
History and Physical       Hospital Admission Note Date: 05/24/2014  Patient name: Albert Hansen Medical record number: [id number] Date of birth: 1929-07-12 Age: 80 y.o. Gender: male  PCP: Jilda Panda, MD    Chief Complaint:  Shortness of breath with weakness, redness of his left arm with swelling  HPI: Patient is 80 year old male with diabetes mellitus, insulin-dependent, COPD, hypertension, GERD, BOOP with chronic respiratory failure on 4 L O2 [place] and dyspnea presented to ED with above complaints. Patient reported weakness and stated that he noticed redness and swelling to his left arm. He reported 3-4 day history of fevers and malaise, generalized weakness and he was bit by the cat on the left hand. Patient was bit by his cat 3 days ago on the left hand. Patient reported his symptoms started after the cat bite. In the ED, patient was noted to be tachycardiac with heart rate in 130s, BP in low 70s.  Patient also has generalized skin cancer and is following dermatology, Dr. Allyson Sabal. The patient has history of COPD, noticed increasing shortness of breath since yesterday with chills, O2 sats this morning were 92%, patient's wife reported his blood sugar was 45 this morning and she gave him a honey bun. Due to concern for sepsis, CCM was consulted in ED who discussed in detail with the patient regarding septic shock likely due to cellulitis. Patient wished for no aggressive measures including CPR, ETT/cardioversion with his discussion with PC CM. Critical care requested admission to stepdown unit.   Review of Systems:  Constitutional: + fever, chills, diaphoresis, poor appetite and fatigue.  HEENT: Denies photophobia, eye pain, redness, hearing loss, ear pain, congestion, sore throat, rhinorrhea, sneezing, mouth sores, trouble swallowing, neck pain, neck stiffness and tinnitus.   Respiratory:   +chronic dyspnea please see history of present  illness Cardiovascular: Denies chest pain, palpitations and leg swelling.  Gastrointestinal: Denies nausea, vomiting, abdominal pain, diarrhea, constipation, blood in stool and abdominal distention.  Genitourinary: Denies dysuria, urgency, frequency, hematuria, flank pain and difficulty urinating.  Musculoskeletal: Denies myalgias, back pain, joint swelling, arthralgias and gait problem.  Skin: Please see history of present illness Neurological: Denies dizziness, seizures, syncope, light-headedness, numbness and headaches. + generalized weakness Hematological: Denies adenopathy., personal or family bleeding history Psychiatric/Behavioral: Denies suicidal ideation, mood changes, confusion, nervousness, sleep disturbance and agitation  Past Medical History: Past Medical History  Diagnosis Date  . Cancer     skin  . Diabetes mellitus without complication   . Asthma   . COPD (chronic obstructive pulmonary disease)   . Hypertension   . GERD (gastroesophageal reflux disease)   . BOOP (bronchiolitis obliterans with organizing pneumonia)   . Pulmonary nodules   . Dyspnea   . Chronic respiratory failure    History reviewed. No pertinent past surgical history.  Medications: Prior to Admission medications   Medication Sig Start Date End Date Taking? Authorizing Provider  allopurinol (ZYLOPRIM) 100 MG tablet Take 200 mg by mouth daily.    Historical Provider, MD  budesonide-formoterol (SYMBICORT) 160-4.5 MCG/ACT inhaler Inhale 2 puffs into the lungs 2 (two) times daily.     Historical Provider, MD  COMBIVENT RESPIMAT 20-100 MCG/ACT AERS respimat 1 puff every 6 (six) hours as needed.     Historical Provider, MD  diazepam (VALIUM) 5 MG tablet Take by mouth daily. 2.5 mg daily    Historical Provider, MD  esomeprazole (NEXIUM) 40 MG packet Take 40 mg by mouth daily before breakfast. [id number] 11/04/13   Herbie Baltimore  S Byrum, MD  insulin NPH (HUMULIN N,NOVOLIN N) 100 UNIT/ML injection Inject into the skin.  20 units    Historical Provider, MD  insulin regular (NOVOLIN R,HUMULIN R) 100 units/mL injection Inject into the skin 2 (two) times daily before a meal. 10 units    Historical Provider, MD  lisinopril (PRINIVIL,ZESTRIL) 40 MG tablet Take 40 mg by mouth daily.    Historical Provider, MD  metoprolol succinate (TOPROL-XL) 50 MG 24 hr tablet Take 50 mg by mouth daily. 1/2 pill in AM (25mg ) and 1/2 pill in PM (25mg )    Historical Provider, MD  pravastatin (PRAVACHOL) 40 MG tablet Take 40 mg by mouth daily.    Historical Provider, MD  predniSONE (DELTASONE) 10 MG tablet Take 10 mg by mouth daily with breakfast.    Historical Provider, MD  predniSONE (DELTASONE) 10 MG tablet Take 2 tablets x 2 weeks; then 1 1/2 tablets daily 04/14/14   Collene Gobble, MD  Tamsulosin HCl (FLOMAX) 0.4 MG CAPS Take 0.4 mg by mouth daily.    Historical Provider, MD  tiotropium (SPIRIVA) 18 MCG inhalation capsule Place 18 mcg into inhaler and inhale daily.    Historical Provider, MD  Tiotropium Bromide Monohydrate (SPIRIVA RESPIMAT) 2.5 MCG/ACT AERS Inhale 2 puffs into the lungs daily. 04/14/14   Collene Gobble, MD  traZODone (DESYREL) 50 MG tablet Take 50 mg by mouth at bedtime.    Historical Provider, MD    Allergies:   Allergies  Allergen Reactions  . Cefaclor     Social History:  reports that he quit smoking about 17 years ago. His smoking use included Cigarettes. He has a 50 pack-year smoking history. He quit smokeless tobacco use about 5 years ago. His smokeless tobacco use included Chew. He reports that he drinks alcohol. He reports that he does not use illicit drugs.  Family History: No family history on file.  Physical Exam: Blood pressure 101/55, pulse 130, temperature 102.7 F (39.3 C), temperature source Rectal, resp. rate 23, height 5\' 9"  (1.753 m), weight 90.719 kg (200 lb), SpO2 96 %. General: Alert, awake, oriented x3, in no acute distress, ill-appearing. HEENT: normocephalic, atraumatic, anicteric  sclera, pink conjunctiva, pupils equal and reactive to light and accomodation, oropharynx clear Neck: supple, no masses or lymphadenopathy, no goiter, no bruits  Heart: Tachycardia, Regular rate and rhythm, without murmurs, rubs or gallops. Lungs: Decreased breath sound at the bases Abdomen: Soft, nontender, nondistended, positive bowel sounds, no masses. Extremities: No clubbing, cyanosis or edema with positive pedal pulses. Neuro: Grossly intact, no focal neurological deficits, strength 5/5 upper and lower extremities bilaterally Psych: alert and oriented x 3, normal mood and affect Skin:  Erythematous, somewhat scaly, left arm and hand minimal swelling, right arm also scaly and erythematous, per wife that is normal appearance of his right arm due to skin cancers    LABS on Admission:  Basic Metabolic Panel:  Recent Labs Lab 05/29/2014 1237  NA 143  K 4.7  CL 100  CO2 25  GLUCOSE 142*  BUN 27*  CREATININE 2.41*  CALCIUM 9.0   Liver Function Tests:  Recent Labs Lab 05/27/2014 1237  AST 16  ALT 10  ALKPHOS 51  BILITOT 0.8  PROT 6.4  ALBUMIN 3.0*   No results for input(s): LIPASE, AMYLASE in the last 168 hours. No results for input(s): AMMONIA in the last 168 hours. CBC:  Recent Labs Lab 06/01/2014 1237  WBC 28.6*  NEUTROABS 23.5*  HGB 14.7  HCT 46.2  MCV 91.1  PLT 218   Cardiac Enzymes: No results for input(s): CKTOTAL, CKMB, CKMBINDEX, TROPONINI in the last 168 hours. BNP: Invalid input(s): POCBNP CBG:  Recent Labs Lab 05/12/2014 1239  GLUCAP 140*     Radiological Exams on Admission: Dg Chest Port 1 View  05/31/2014   CLINICAL DATA:  Fever. Shortness of breath and tachycardia. ICD10: R 06.02  EXAM: PORTABLE CHEST - 1 VIEW  COMPARISON:  09/23/2013 chest radiograph.  03/11/2014 CT.  FINDINGS: Apical lordotic positioning. Midline trachea. Cardiomegaly accentuated by AP portable technique. Mediastinal contours otherwise within normal limits. No pleural  effusion or pneumothorax. Clear lungs.  IMPRESSION: Cardiomegaly without congestive failure.   Electronically Signed   By: Abigail Miyamoto M.D.   On: 05/12/2014 13:54    Assessment/Plan Principal Problem:   Sepsis/septic shock possibly due to cellulitis with elevated lactic acid, pro-calcitonin, tachycardia, hypotension -Admit to stepdown, obtain blood cultures, patient was started on IV vancomycin, discussed in detail with infectious disease, Dr. Johnnye Sima recommended addition of Unasyn with vancomycin. - obtain left hand xray, continue IV fluid hydration   Active Problems: Hypotension: Likely due to #1, also patient has been on chronic prednisone possibly causing relative adrenal insufficiency - Hold all antihypertensives, continue IV fluid hydration, obtain cortisol level - Place on IV Solu-Cortef and taper per clinical improvement   Acute on  Chronic respiratory failure Likely due to #1, COPD exacerbation - Continue IV antibiotics, scheduled bronchodilators, incentive spirometry, IV steroids    GERD (gastroesophageal reflux disease) - Continue PPI  Diabetes mellitus with hypoglycemia episode this morning - Place on sliding scale insulin, obtain hemoglobin A1c    Acute kidney injury: Likely due to #1, - Baseline creatinine unknown, will continue IV fluid hydration, follow BMET - Hold lisinopril    Elevated troponin - Like to be due to demand ischemia from septic shock  - Cannot give beta blockers or ACEI due to hypotension and acute renal insufficiency - Obtain serial cardiac enzymes, 2-D echocardiogram  DVT prophylaxis:  heparin subcutaneous   CODE STATUS:  partial code, discussed in detail with the patient, does not want CPR, intubation or cardioversion, okay with vasopressors and BiPAP   Family Communication: Admission, patients condition and plan of care including tests being ordered have been discussed with the patient and  Wife who indicates understanding and agree with the  plan and Code Status   Further plan will depend as patient's clinical course evolves and further radiologic and laboratory data become available.   Time Spent on Admission: 1 hour  RAI,RIPUDEEP M.D. [facility] 05/16/2014, 2:48 PM Pager: [phone]  If 7PM-7AM, please contact night-coverage [email or web address] Password [email or web address]

## 2014-05-13 NOTE — ED Notes (Signed)
Admitting Doctor at bedside 

## 2014-05-13 NOTE — ED Notes (Signed)
Spoke with Radiology will send technician to complete x-ray

## 2014-05-13 NOTE — ED Notes (Signed)
Spoke with EDP stated to give additional 523ml bolus and reevaluated after chest x-ray completed.

## 2014-05-13 NOTE — ED Notes (Signed)
Code sepsis paged supposedly paged out [phone].

## 2014-05-13 NOTE — Progress Notes (Addendum)
ANTIBIOTIC CONSULT NOTE - INITIAL  Pharmacy Consult for Vancomycin, Zosyn Indication: Sepsis  Allergies  Allergen Reactions  . Cefaclor     Patient Measurements: Height: 5\' 9"  (175.3 cm) Weight: 200 lb (90.719 kg) IBW/kg (Calculated) : 70.7  Vital Signs: Temp: 98.2 F (36.8 C) (12/02 1233) Temp Source: Oral (12/02 1233) BP: 85/58 mmHg (12/02 1233) Pulse Rate: 144 (12/02 1233) Intake/Output from previous day:   Intake/Output from this shift:    Labs: No results for input(s): WBC, HGB, PLT, LABCREA, CREATININE in the last 72 hours. CrCl cannot be calculated (Patient has no serum creatinine result on file.). No results for input(s): VANCOTROUGH, VANCOPEAK, VANCORANDOM, GENTTROUGH, GENTPEAK, GENTRANDOM, TOBRATROUGH, TOBRAPEAK, TOBRARND, AMIKACINPEAK, AMIKACINTROU, AMIKACIN in the last 72 hours.   Microbiology: No results found for this or any previous visit (from the past 720 hour(s)).  Medical History: Past Medical History  Diagnosis Date  . Cancer     skin  . Diabetes mellitus without complication   . Asthma   . COPD (chronic obstructive pulmonary disease)   . Hypertension   . GERD (gastroesophageal reflux disease)   . BOOP (bronchiolitis obliterans with organizing pneumonia)   . Pulmonary nodules   . Dyspnea   . Chronic respiratory failure     Medications:   (Not in a hospital admission) Assessment: 69 YOM with h/o COPD on home O2, BOOP, HTN, DM, skin cancer presented to the ER with 3-4 day hx of fever, malaise which started after cat bite to L hand. Pharmacy consulted to start empiric antibiotics for Sepsis. WBC is elevated at 28.6. Pt is afebrile. LA 4.93. PCT 0.54. CrCl ~ 26.3 mL/min.   Pt has a listed allergy to Cefaclor but states he is not sure what the allergy was and that he tolerates penicillins.   Cultures: 12/2 Blood Cx x2>> 12/2 Urine Cx>>   Goal of Therapy:  Vancomycin trough level 15-20 mcg/ml  Plan:  -Start Zosyn 3.375 gm IV Q  hours -Vancomycin 1500 mg IV load in ED followed by Vanc 1 gm IV Q 24 hours  -Monitor CBC, renal fx, cultures and patient's clinical progress -[facility] at [place], PharmD., BCPS Clinical Pharmacist Pager [phone]  [place] (06/01/2014 @1502 ): To change zosyn to unasyn.  Will start Unasyn 3gm IV q12h. Dia Sitter, PharmD, BCPS

## 2014-05-13 NOTE — Telephone Encounter (Signed)
Left detailed message with patient daughter Jenny Reichmann, no need for call back, just letting her know that we are aware that they are at the ED and we will make Dr Lamonte Sakai aware.

## 2014-05-13 NOTE — Telephone Encounter (Signed)
Ok thank you 

## 2014-05-13 NOTE — ED Notes (Signed)
Patient to xray.

## 2014-05-13 NOTE — ED Notes (Signed)
Second set of blood culture 1325

## 2014-05-14 ENCOUNTER — Ambulatory Visit: Payer: Medicare Other | Admitting: Emergency Medicine

## 2014-05-14 DIAGNOSIS — IMO0002 Reserved for concepts with insufficient information to code with codable children: Secondary | ICD-10-CM | POA: Diagnosis present

## 2014-05-14 DIAGNOSIS — L0390 Cellulitis, unspecified: Secondary | ICD-10-CM

## 2014-05-14 DIAGNOSIS — R6521 Severe sepsis with septic shock: Secondary | ICD-10-CM

## 2014-05-14 DIAGNOSIS — A419 Sepsis, unspecified organism: Secondary | ICD-10-CM | POA: Diagnosis present

## 2014-05-14 DIAGNOSIS — J9620 Acute and chronic respiratory failure, unspecified whether with hypoxia or hypercapnia: Secondary | ICD-10-CM | POA: Diagnosis present

## 2014-05-14 DIAGNOSIS — D899 Disorder involving the immune mechanism, unspecified: Secondary | ICD-10-CM

## 2014-05-14 DIAGNOSIS — J441 Chronic obstructive pulmonary disease with (acute) exacerbation: Secondary | ICD-10-CM | POA: Diagnosis present

## 2014-05-14 DIAGNOSIS — E1165 Type 2 diabetes mellitus with hyperglycemia: Secondary | ICD-10-CM

## 2014-05-14 DIAGNOSIS — I9589 Other hypotension: Secondary | ICD-10-CM

## 2014-05-14 DIAGNOSIS — E2740 Unspecified adrenocortical insufficiency: Secondary | ICD-10-CM | POA: Diagnosis present

## 2014-05-14 LAB — BASIC METABOLIC PANEL
Anion gap: 17 — ABNORMAL HIGH (ref 5–15)
BUN: 35 mg/dL — AB (ref 6–23)
CO2: 20 mEq/L (ref 19–32)
Calcium: 7.8 mg/dL — ABNORMAL LOW (ref 8.4–10.5)
Chloride: 98 mEq/L (ref 96–112)
Creatinine, Ser: 2.51 mg/dL — ABNORMAL HIGH (ref 0.50–1.35)
GFR calc Af Amer: 26 mL/min — ABNORMAL LOW (ref >90)
GFR calc non Af Amer: 22 mL/min — ABNORMAL LOW (ref >90)
GLUCOSE: 445 mg/dL — AB (ref 70–99)
Potassium: 5.4 mEq/L — ABNORMAL HIGH (ref 3.7–5.3)
Sodium: 135 mEq/L — ABNORMAL LOW (ref 137–147)

## 2014-05-14 LAB — URINE CULTURE
Colony Count: NO GROWTH
Culture: NO GROWTH

## 2014-05-14 LAB — CBC
HEMATOCRIT: 41 % (ref 39.0–52.0)
Hemoglobin: 13 g/dL (ref 13.0–17.0)
MCH: 28.8 pg (ref 26.0–34.0)
MCHC: 31.7 g/dL (ref 30.0–36.0)
MCV: 90.9 fL (ref 78.0–100.0)
Platelets: 175 10*3/uL (ref 150–400)
RBC: 4.51 MIL/uL (ref 4.22–5.81)
RDW: 14.8 % (ref 11.5–15.5)
WBC: 21.8 10*3/uL — ABNORMAL HIGH (ref 4.0–10.5)

## 2014-05-14 LAB — GLUCOSE, CAPILLARY
GLUCOSE-CAPILLARY: 244 mg/dL — AB (ref 70–99)
GLUCOSE-CAPILLARY: 388 mg/dL — AB (ref 70–99)
Glucose-Capillary: 336 mg/dL — ABNORMAL HIGH (ref 70–99)
Glucose-Capillary: 261 mg/dL — ABNORMAL HIGH (ref 70–99)
Glucose-Capillary: 296 mg/dL — ABNORMAL HIGH (ref 70–99)
Glucose-Capillary: 234 mg/dL — ABNORMAL HIGH (ref 70–99)
Glucose-Capillary: 206 mg/dL — ABNORMAL HIGH (ref 70–99)

## 2014-05-14 LAB — TROPONIN I
TROPONIN I: 2.79 ng/mL — AB (ref <0.30)
Troponin I: 2.21 ng/mL (ref <0.30)

## 2014-05-14 LAB — HEMOGLOBIN A1C
HEMOGLOBIN A1C: 8.1 % — AB (ref <5.7)
Mean Plasma Glucose: 186 mg/dL — ABNORMAL HIGH (ref <117)

## 2014-05-14 MED ORDER — LEVALBUTEROL HCL 0.63 MG/3ML IN NEBU: 0.6300 mg | INHALATION_SOLUTION | RESPIRATORY_TRACT | Status: DC | PRN

## 2014-05-14 MED ORDER — LEVALBUTEROL HCL 1.25 MG/0.5ML IN NEBU
1.2500 mg | INHALATION_SOLUTION | Freq: Four times a day (QID) | RESPIRATORY_TRACT | Status: DC
  Filled 2014-05-14 (×4): qty 0.5

## 2014-05-14 MED ORDER — INSULIN ASPART 100 UNIT/ML ~~LOC~~ SOLN
4.0000 [IU] | Freq: Once | SUBCUTANEOUS | Status: AC
  Administered 2014-05-14: 4 [IU] via SUBCUTANEOUS

## 2014-05-14 MED ORDER — LEVALBUTEROL HCL 1.25 MG/0.5ML IN NEBU
1.2500 mg | INHALATION_SOLUTION | Freq: Three times a day (TID) | RESPIRATORY_TRACT | Status: DC
  Administered 2014-05-14 – 2014-05-15 (×5): 1.25 mg via RESPIRATORY_TRACT
  Filled 2014-05-14 (×6): qty 0.5

## 2014-05-14 MED ORDER — INSULIN NPH (HUMAN) (ISOPHANE) 100 UNIT/ML ~~LOC~~ SUSP
10.0000 [IU] | Freq: Two times a day (BID) | SUBCUTANEOUS | Status: DC
  Administered 2014-05-14: 10 [IU] via SUBCUTANEOUS
  Filled 2014-05-14: qty 10

## 2014-05-14 MED ORDER — INSULIN ASPART 100 UNIT/ML ~~LOC~~ SOLN
0.0000 [IU] | SUBCUTANEOUS | Status: DC
  Administered 2014-05-14: 7 [IU] via SUBCUTANEOUS
  Administered 2014-05-14 (×2): 11 [IU] via SUBCUTANEOUS
  Administered 2014-05-15: 15 [IU] via SUBCUTANEOUS
  Administered 2014-05-15: 4 [IU] via SUBCUTANEOUS
  Administered 2014-05-15: 5 [IU] via SUBCUTANEOUS
  Administered 2014-05-15 (×2): 11 [IU] via SUBCUTANEOUS
  Administered 2014-05-15: 7 [IU] via SUBCUTANEOUS

## 2014-05-14 MED ORDER — METOPROLOL TARTRATE 1 MG/ML IV SOLN
5.0000 mg | INTRAVENOUS | Status: DC
  Administered 2014-05-14 – 2014-05-15 (×9): 5 mg via INTRAVENOUS
  Filled 2014-05-14 (×11): qty 5

## 2014-05-14 MED ORDER — INSULIN NPH (HUMAN) (ISOPHANE) 100 UNIT/ML ~~LOC~~ SUSP
10.0000 [IU] | Freq: Every day | SUBCUTANEOUS | Status: DC
  Administered 2014-05-14: 10 [IU] via SUBCUTANEOUS
  Filled 2014-05-14: qty 10

## 2014-05-14 NOTE — Plan of Care (Signed)
Problem: Phase II Progression Outcomes Goal: Tolerating diet Outcome: Progressing     

## 2014-05-14 NOTE — Progress Notes (Signed)
Inpatient Diabetes Program Recommendations  AACE/ADA: New Consensus Statement on Inpatient Glycemic Control (2013)  Target Ranges:  Prepandial:   less than 140 mg/dL      Peak postprandial:   less than 180 mg/dL (1-2 hours)      Critically ill patients:  140 - 180 mg/dL   Reason for Assessment:  Results for COLEMAN, KALAS (MRN [id number]) as of 05/14/2014 09:56  Ref. Range 06/06/2014 17:22 05/26/2014 22:36 05/14/2014 00:51 05/14/2014 03:20 05/14/2014 07:30  Glucose-Capillary Latest Range: 70-99 mg/dL 204 (H) 431 (H) 388 (H) 336 (H) 244 (H)   Diabetes history: Type 2 diabetes Outpatient Diabetes medications: NPH 20 units daily, Regular insulin 10 units bid Current orders for Inpatient glycemic control:  Novolog moderate tid with meals and HS-Solucortef 50 mg IV q 8 hours  Please add NPH 20 units q AM and consider adding Novolog 5 units tid with meals  Thanks, Adah Perl, RN, BC-ADM Inpatient Diabetes Coordinator Pager [phone]

## 2014-05-14 NOTE — Progress Notes (Signed)
INFECTIOUS DISEASE PROGRESS NOTE  ID: Albert Hansen is a 80 y.o. male with  Principal Problem:   Sepsis Active Problems:   Chronic respiratory failure   GERD (gastroesophageal reflux disease)   Hypotension   Cellulitis   Acute kidney injury   Elevated troponin   Immunosuppressed status  Subjective: Without complaints, swelling improved and then now back to previous.  Denies fever.   Abtx:  Anti-infectives    Start     Dose/Rate Route Frequency Ordered Stop   05/14/14 1400  vancomycin (VANCOCIN) IVPB 1000 mg/200 mL premix     1,000 mg200 mL/hr over 60 Minutes Intravenous Every 24 hours 06/01/2014 1419     06/08/2014 2000  piperacillin-tazobactam (ZOSYN) IVPB 3.375 g  Status:  Discontinued     3.375 g12.5 mL/hr over 240 Minutes Intravenous Every 8 hours 06/05/2014 1419 05/31/2014 1451   05/25/2014 2000  Ampicillin-Sulbactam (UNASYN) 3 g in sodium chloride 0.9 % 100 mL IVPB     3 g100 mL/hr over 60 Minutes Intravenous Every 12 hours 06/11/2014 1507     05/14/2014 1400  piperacillin-tazobactam (ZOSYN) IVPB 3.375 g     3.375 g100 mL/hr over 30 Minutes Intravenous  Once 05/12/2014 1323 05/12/2014 1420   05/23/2014 1330  vancomycin (VANCOCIN) 1,500 mg in sodium chloride 0.9 % 500 mL IVPB     1,500 mg250 mL/hr over 120 Minutes Intravenous  Once 05/12/2014 1323 05/19/2014 1614      Medications:  Scheduled: . ampicillin-sulbactam (UNASYN) IV  3 g Intravenous Q12H  . budesonide-formoterol  2 puff Inhalation BID  . heparin  5,000 Units Subcutaneous 3 times per day  . hydrocortisone sod succinate (SOLU-CORTEF) inj  50 mg Intravenous Q8H  . insulin aspart  0-20 Units Subcutaneous 6 times per day  . insulin NPH Human  10 Units Subcutaneous QAC breakfast  . levalbuterol  1.25 mg Nebulization TID  . metoprolol  5 mg Intravenous Q4H  . pantoprazole  40 mg Oral Daily  . pravastatin  40 mg Oral Daily  . sodium chloride  3 mL Intravenous Q12H  . traZODone  50 mg Oral QHS  . vancomycin  1,000 mg Intravenous Q24H     Objective: Vital signs in last 24 hours: Temp:  [97.2 F (36.2 C)-100.8 F (38.2 C)] 98.4 F (36.9 C) (12/03 1113) Pulse Rate:  [96-128] 98 (12/03 1200) Resp:  [16-30] 22 (12/03 1200) BP: (95-145)/(51-90) 134/81 mmHg (12/03 1200) SpO2:  [88 %-100 %] 90 % (12/03 1358) Weight:  [92.4 kg (203 lb 11.3 oz)] 92.4 kg (203 lb 11.3 oz) (12/02 1708)   General appearance: alert, cooperative and no distress Resp: clear to auscultation bilaterally Cardio: regular rate and rhythm GI: normal findings: bowel sounds normal and soft, non-tender and abnormal findings:  distended Extremities: L arm erythema, swelling, there are few areas of swelling focally.   Lab Results  Recent Labs  05/12/2014 1237 05/14/14 0044  WBC 28.6* 21.8*  HGB 14.7 13.0  HCT 46.2 41.0  NA 143 135*  K 4.7 5.4*  CL 100 98  CO2 25 20  BUN 27* 35*  CREATININE 2.41* 2.51*   Liver Panel  Recent Labs  06/06/2014 1237  PROT 6.4  ALBUMIN 3.0*  AST 16  ALT 10  ALKPHOS 51  BILITOT 0.8   Sedimentation Rate No results for input(s): ESRSEDRATE in the last 72 hours. C-Reactive Protein No results for input(s): CRP in the last 72 hours.  Microbiology: Recent Results (from the past 240 hour(s))  Blood  Culture (routine x 2)     Status: None (Preliminary result)   Collection Time: 06/11/2014 12:40 PM  Result Value Ref Range Status   Specimen Description BLOOD RIGHT ANTECUBITAL  Final   Special Requests BOTTLES DRAWN AEROBIC ONLY 10ML  Final   Culture  Setup Time   Final    06/11/2014 17:05 Performed at [facility]    Culture   Final           BLOOD CULTURE RECEIVED NO GROWTH TO DATE CULTURE WILL BE HELD FOR 5 DAYS BEFORE ISSUING A FINAL NEGATIVE REPORT Performed at [facility]    Report Status PENDING  Incomplete  Urine culture     Status: None   Collection Time: 05/17/2014  1:14 PM  Result Value Ref Range Status   Specimen Description URINE, CATHETERIZED  Final   Special Requests NONE  Final    Culture  Setup Time   Final    05/16/2014 14:01 Performed at [facility] Performed at [facility]   Final   Culture NO GROWTH Performed at [facility]   Final   Report Status 05/14/2014 FINAL  Final  Blood Culture (routine x 2)     Status: None (Preliminary result)   Collection Time: 05/30/2014  1:25 PM  Result Value Ref Range Status   Specimen Description BLOOD RIGHT ANTECUBITAL  Final   Special Requests BOTTLES DRAWN AEROBIC ONLY 6ML  Final   Culture  Setup Time   Final    05/26/2014 21:17 Performed at [facility]    Culture   Final           BLOOD CULTURE RECEIVED NO GROWTH TO DATE CULTURE WILL BE HELD FOR 5 DAYS BEFORE ISSUING A FINAL NEGATIVE REPORT Performed at [facility]    Report Status PENDING  Incomplete  MRSA PCR Screening     Status: None   Collection Time: 05/28/2014  5:22 PM  Result Value Ref Range Status   MRSA by PCR NEGATIVE NEGATIVE Final    Comment:        The GeneXpert MRSA Assay (FDA approved for NASAL specimens only), is one component of a comprehensive MRSA colonization surveillance program. It is not intended to diagnose MRSA infection nor to guide or monitor treatment for MRSA infections.     Studies/Results: Dg Chest Port 1 View  05/20/2014   CLINICAL DATA:  Fever. Shortness of breath and tachycardia. ICD10: R 06.02  EXAM: PORTABLE CHEST - 1 VIEW  COMPARISON:  09/23/2013 chest radiograph.  03/11/2014 CT.  FINDINGS: Apical lordotic positioning. Midline trachea. Cardiomegaly accentuated by AP portable technique. Mediastinal contours otherwise within normal limits. No pleural effusion or pneumothorax. Clear lungs.  IMPRESSION: Cardiomegaly without congestive failure.   Electronically Signed   By: Abigail Miyamoto M.D.   On: 06/03/2014 13:54   Dg Hand Complete Left  05/31/2014   CLINICAL DATA:  Scratched and hit by a cat 4-5 days ago. Pain at the base of the first metacarpal.  EXAM:  LEFT HAND - COMPLETE 3+ VIEW  COMPARISON:  None.  FINDINGS: The joint spaces are maintained. No acute bony findings or radiopaque foreign body. Mild DIP and PIP joint degenerative changes and mild degenerative changes at the carpal metacarpal joint of the thumb.  IMPRESSION: No acute bony findings or radiopaque foreign body.   Electronically Signed   By: Kalman Jewels M.D.   On: 05/14/2014 16:29  Assessment/Plan: Sepsis Cat bite Cellulitis + troponins Immunosuppression DM2 CRI  Total days of antibiotics: 2 vanco/unasyn  His arm is unchanged If any worsening consider MRI, hand surgery eval.  WBC better Defer to primary regarding + troponins         [name] Infectious Diseases (pager) [phone] [email or web address] 05/14/2014, 2:33 PM  LOS: 1 day

## 2014-05-14 NOTE — Progress Notes (Signed)
[place] TEAM 1 - Stepdown/ICU TEAM Progress Note  Albert Hansen WSF:[id number] DOB: 26-May-1930 DOA: 06/11/2014 PCP: Jilda Panda, MD  Admit HPI / Brief Narrative: Patient is 80 year old WM PMHx diabetes mellitus, insulin-dependent, COPD, hypertension, GERD, BOOP with chronic respiratory failure on 4 L O2 [place] and dyspnea presented to ED with above complaints. Patient reported weakness and stated that he noticed redness and swelling to his left arm. He reported 3-4 day history of fevers and malaise, generalized weakness and he was bit by the cat on the left hand. Patient was bit by his cat 3 days ago on the left hand. Patient reported his symptoms started after the cat bite. In the ED, patient was noted to be tachycardiac with heart rate in 130s, BP in low 70s.  Patient also has generalized skin cancer and is following dermatology, Dr. Allyson Sabal. The patient has history of COPD, noticed increasing shortness of breath since yesterday with chills, O2 sats this morning were 92%, patient's wife reported his blood sugar was 45 this morning and she gave him a honey bun. Due to concern for sepsis, CCM was consulted in ED who discussed in detail with the patient regarding septic shock likely due to cellulitis. Patient wished for no aggressive measures including CPR, ETT/cardioversion with his discussion with PCCM. Critical care requested admission to stepdown unit.    HPI/Subjective: 12/3 A/O 4. Patient states left hand and arm pain have decreased, main concern is that we ensure tight control of his diabetes  Assessment/Plan: Sepsis/septic shock; -Most likely secondary to cellulitis secondary to cat bite/scratch -Dr. Johnnye Sima (Infectious disease) consulted and recommended Unasyn + vancomycin will continue -If patient does not continue to improve may require MRI of hand and wrist -Blood cultures pending  Hypotension:  -Most likely multifactorial to include septic shock, adrenal insufficiency  -Resolved  -  Hold all antihypertensives, continue IV fluid hydration, obtain cortisol level - Continue  IV Solu-Cortef 50 mg TID  Adrenal insufficiency (patient's home dose prednisone 15 mg daily) -Patient has been on chronic steroids for many years secondary to COPD/BOOP -Continue Solu-Cortef  Acute on Chronic respiratory failure  -Multifactorial sepsis, hypotension, COPD exacerbation -chronic respiratory failure on 4 L O2 [place] at home - Continue IV antibiotics, scheduled bronchodilators, incentive spirometry, IV steroids  COPD exacerbation -See acute on chronic respiratory failure  GERD (gastroesophageal reflux disease) - Continue PPI  Diabetes mellitus type II uncontrolled  -12/2 hemoglobin A1c = 8.1  -Insulin NPH 10 units BID -Resistant SSI  Acute kidney injury:  -Likely secondary to hypertension, and uncontrolled diabetes  - Continue normal saline at 125 ml/hr  -Avoid all nephrotoxic medication; if creatinine not improving in the a.m. will change vancomycin to linezolid  Elevated troponin - Like to be due to demand ischemia from septic shock  - Cannot give beta blockers or ACEI due to hypotension and acute renal insufficiency - Obtain serial cardiac enzymes, 2-D echocardiogram    Code Status: FULL Family Communication: no family present at time of exam Disposition Plan: Resolution of sepsis    Consultants: Dr. Johnnye Sima (Infectious disease)  Procedure/Significant Events:    Culture 12/2 blood right antecubital 2 NGTD 12/2 urine negative 12/2 MRSA by PCR negative   Antibiotics: Unasyn 12/2>> Vancomycin>>   DVT prophylaxis: Subcutaneous heparin   Devices   LINES / TUBES:      Continuous Infusions: . sodium chloride 125 mL/hr at 05/25/2014 1521    Objective: VITAL SIGNS: Temp: 97.2 F (36.2 C) (12/03 0731) Temp Source: Oral (12/03 0731)  BP: 126/77 mmHg (12/03 0731) Pulse Rate: 106 (12/03 0731) SPO2; FIO2:   Intake/Output Summary (Last 24 hours)  at 05/14/14 0905 Last data filed at 05/14/14 0850  Gross per 24 hour  Intake   2105 ml  Output   1050 ml  Net   1055 ml     Exam: General: A/O 4, NAD, chronic mild Respiratory distress Lungs: Diffuse expiratory wheeze, bilateral apical rhonchi  Cardiovascular: Regular rate and rhythm without murmur gallop or rub normal S1 and S2 Abdomen: Nontender, nondistended, soft, bowel sounds positive, no rebound, no ascites, no appreciable mass Extremities: No significant cyanosis, clubbing, or edema bilateral lower extremities; left hand and arm erythema, tenderness to palpation from the wrist-just below the shoulder, positive swelling, warm to the touch NOTE patient has an underlying skin disease  Data Reviewed: Basic Metabolic Panel:  Recent Labs Lab 05/25/2014 1237 05/14/14 0044  NA 143 135*  K 4.7 5.4*  CL 100 98  CO2 25 20  GLUCOSE 142* 445*  BUN 27* 35*  CREATININE 2.41* 2.51*  CALCIUM 9.0 7.8*   Liver Function Tests:  Recent Labs Lab 06/03/2014 1237  AST 16  ALT 10  ALKPHOS 51  BILITOT 0.8  PROT 6.4  ALBUMIN 3.0*   No results for input(s): LIPASE, AMYLASE in the last 168 hours. No results for input(s): AMMONIA in the last 168 hours. CBC:  Recent Labs Lab 06/05/2014 1237 05/14/14 0044  WBC 28.6* 21.8*  NEUTROABS 23.5*  --   HGB 14.7 13.0  HCT 46.2 41.0  MCV 91.1 90.9  PLT 218 175   Cardiac Enzymes:  Recent Labs Lab 06/01/2014 1909 05/14/14 0044  TROPONINI 1.15* 2.79*   BNP (last 3 results)  Recent Labs  06/08/2014 1240  PROBNP 2851.0*   CBG:  Recent Labs Lab 05/17/2014 1722 06/03/2014 2236 05/14/14 0051 05/14/14 0320 05/14/14 0730  GLUCAP 204* 431* 388* 336* 244*    Recent Results (from the past 240 hour(s))  MRSA PCR Screening     Status: None   Collection Time: 05/24/2014  5:22 PM  Result Value Ref Range Status   MRSA by PCR NEGATIVE NEGATIVE Final    Comment:        The GeneXpert MRSA Assay (FDA approved for NASAL specimens only), is one  component of a comprehensive MRSA colonization surveillance program. It is not intended to diagnose MRSA infection nor to guide or monitor treatment for MRSA infections.      Studies:  Recent x-ray studies have been reviewed in detail by the Attending Physician  Scheduled Meds:  Scheduled Meds: . ampicillin-sulbactam (UNASYN) IV  3 g Intravenous Q12H  . budesonide-formoterol  2 puff Inhalation BID  . heparin  5,000 Units Subcutaneous 3 times per day  . hydrocortisone sod succinate (SOLU-CORTEF) inj  50 mg Intravenous Q8H  . insulin aspart  0-15 Units Subcutaneous TID WC  . insulin aspart  0-5 Units Subcutaneous QHS  . levalbuterol  1.25 mg Nebulization 4 times per day  . metoprolol  5 mg Intravenous Q4H  . pantoprazole  40 mg Oral Daily  . pravastatin  40 mg Oral Daily  . sodium chloride  3 mL Intravenous Q12H  . traZODone  50 mg Oral QHS  . vancomycin  1,000 mg Intravenous Q24H    Time spent on care of this patient: 40 mins   Allie Bossier , MD   [facility] Office  [phone] Pager [phone]  On-Call/Text Page:      [email or web address]  password [email or web address]  If 7PM-7AM, please contact night-coverage [email or web address] Password [email or web address] 05/14/2014, 9:05 AM   LOS: 1 day

## 2014-05-14 NOTE — Plan of Care (Signed)
Problem: Phase I Progression Outcomes Goal: Voiding-avoid urinary catheter unless indicated Outcome: Progressing     

## 2014-05-14 NOTE — Progress Notes (Signed)
*  PRELIMINARY RESULTS* Echocardiogram 2D Echocardiogram has been performed.  Leavy Cella 05/14/2014, 10:10 AM

## 2014-05-14 NOTE — Progress Notes (Signed)
Follow-up:  Notified by RN regarding 2nd troponin result of 1.15 from 0.21(i poc in ED) at 1423 today. Pt continues to deny CP and resting comfortably in NAD. Dr Alejandro Mulling w/ cardiology service consulted who felt troponin elevation significance unclear given current renal function vs demand ischemia from septic shock. His recommendation for now is to f/u results of 2-D echo and re-consult cardiology as indicated unless troponin elevation becomes significantly higher and or pt status changes. Will continue to monitor closely in SDU.   Jeryl Columbia, NP-C [facility] Pager [phone]

## 2014-05-15 ENCOUNTER — Encounter: Payer: Self-pay | Admitting: Emergency Medicine

## 2014-05-15 DIAGNOSIS — L03115 Cellulitis of right lower limb: Secondary | ICD-10-CM

## 2014-05-15 LAB — URINE CULTURE
Colony Count: NO GROWTH
Culture: NO GROWTH

## 2014-05-15 LAB — COMPREHENSIVE METABOLIC PANEL
ALBUMIN: 2.6 g/dL — AB (ref 3.5–5.2)
ALK PHOS: 46 U/L (ref 39–117)
ALT: 13 U/L (ref 0–53)
AST: 19 U/L (ref 0–37)
Anion gap: 15 (ref 5–15)
BUN: 35 mg/dL — ABNORMAL HIGH (ref 6–23)
CHLORIDE: 105 meq/L (ref 96–112)
CO2: 21 mEq/L (ref 19–32)
Calcium: 8.3 mg/dL — ABNORMAL LOW (ref 8.4–10.5)
Creatinine, Ser: 1.99 mg/dL — ABNORMAL HIGH (ref 0.50–1.35)
GFR calc non Af Amer: 30 mL/min — ABNORMAL LOW (ref >90)
GFR, EST AFRICAN AMERICAN: 34 mL/min — AB (ref >90)
GLUCOSE: 206 mg/dL — AB (ref 70–99)
POTASSIUM: 4.7 meq/L (ref 3.7–5.3)
SODIUM: 141 meq/L (ref 137–147)
TOTAL PROTEIN: 6.1 g/dL (ref 6.0–8.3)
Total Bilirubin: 0.3 mg/dL (ref 0.3–1.2)

## 2014-05-15 LAB — MAGNESIUM: Magnesium: 1.9 mg/dL (ref 1.5–2.5)

## 2014-05-15 LAB — CBC WITH DIFFERENTIAL/PLATELET
BASOS PCT: 0 % (ref 0–1)
Basophils Absolute: 0 10*3/uL (ref 0.0–0.1)
EOS ABS: 0 10*3/uL (ref 0.0–0.7)
Eosinophils Relative: 0 % (ref 0–5)
HEMATOCRIT: 40.2 % (ref 39.0–52.0)
Hemoglobin: 12.9 g/dL — ABNORMAL LOW (ref 13.0–17.0)
Lymphocytes Relative: 5 % — ABNORMAL LOW (ref 12–46)
Lymphs Abs: 0.7 10*3/uL (ref 0.7–4.0)
MCH: 28.7 pg (ref 26.0–34.0)
MCHC: 32.1 g/dL (ref 30.0–36.0)
MCV: 89.5 fL (ref 78.0–100.0)
MONO ABS: 0.4 10*3/uL (ref 0.1–1.0)
Monocytes Relative: 3 % (ref 3–12)
Neutro Abs: 13.9 10*3/uL — ABNORMAL HIGH (ref 1.7–7.7)
Neutrophils Relative %: 92 % — ABNORMAL HIGH (ref 43–77)
Platelets: 174 10*3/uL (ref 150–400)
RBC: 4.49 MIL/uL (ref 4.22–5.81)
RDW: 14.5 % (ref 11.5–15.5)
WBC: 15.1 10*3/uL — ABNORMAL HIGH (ref 4.0–10.5)

## 2014-05-15 LAB — GLUCOSE, CAPILLARY
Glucose-Capillary: 151 mg/dL — ABNORMAL HIGH (ref 70–99)
Glucose-Capillary: 203 mg/dL — ABNORMAL HIGH (ref 70–99)
Glucose-Capillary: 267 mg/dL — ABNORMAL HIGH (ref 70–99)
Glucose-Capillary: 290 mg/dL — ABNORMAL HIGH (ref 70–99)
Glucose-Capillary: 330 mg/dL — ABNORMAL HIGH (ref 70–99)

## 2014-05-15 LAB — TROPONIN I
TROPONIN I: 1.97 ng/mL — AB (ref <0.30)
Troponin I: 2.32 ng/mL (ref <0.30)

## 2014-05-15 MED ORDER — HYDROCORTISONE 1 % EX CREA
TOPICAL_CREAM | Freq: Two times a day (BID) | CUTANEOUS | Status: DC
  Administered 2014-05-15: 1 via TOPICAL

## 2014-05-15 MED ORDER — METOPROLOL SUCCINATE 12.5 MG HALF TABLET
12.5000 mg | ORAL_TABLET | Freq: Two times a day (BID) | ORAL | Status: DC
  Administered 2014-05-15: 12.5 mg via ORAL
  Filled 2014-05-15 (×2): qty 1

## 2014-05-15 MED ORDER — HYDROCORTISONE NA SUCCINATE PF 100 MG IJ SOLR
50.0000 mg | Freq: Two times a day (BID) | INTRAMUSCULAR | Status: DC
  Filled 2014-05-15: qty 1

## 2014-05-15 MED ORDER — INSULIN NPH (HUMAN) (ISOPHANE) 100 UNIT/ML ~~LOC~~ SUSP: 30.0000 [IU] | Freq: Two times a day (BID) | SUBCUTANEOUS | Status: DC

## 2014-05-15 MED ORDER — METOPROLOL SUCCINATE ER 25 MG PO TB24
25.0000 mg | ORAL_TABLET | Freq: Two times a day (BID) | ORAL | Status: DC
  Filled 2014-05-15: qty 1

## 2014-05-15 MED ORDER — INSULIN NPH (HUMAN) (ISOPHANE) 100 UNIT/ML ~~LOC~~ SUSP: 20.0000 [IU] | Freq: Two times a day (BID) | SUBCUTANEOUS | Status: DC

## 2014-05-16 MED FILL — Medication: Qty: 1 | Status: AC

## 2014-05-18 LAB — GLUCOSE, CAPILLARY: GLUCOSE-CAPILLARY: 308 mg/dL — AB (ref 70–99)

## 2014-05-19 LAB — CULTURE, BLOOD (ROUTINE X 2)
CULTURE: NO GROWTH
Culture: NO GROWTH

## 2014-05-20 LAB — CULTURE, BLOOD (SINGLE): Culture: NO GROWTH

## 2014-06-11 DIAGNOSIS — N179 Acute kidney failure, unspecified: Secondary | ICD-10-CM | POA: Insufficient documentation

## 2014-06-11 DIAGNOSIS — R6521 Severe sepsis with septic shock: Secondary | ICD-10-CM

## 2014-06-11 DIAGNOSIS — J9621 Acute and chronic respiratory failure with hypoxia: Secondary | ICD-10-CM | POA: Insufficient documentation

## 2014-06-11 DIAGNOSIS — I9589 Other hypotension: Secondary | ICD-10-CM | POA: Insufficient documentation

## 2014-06-11 DIAGNOSIS — A419 Sepsis, unspecified organism: Secondary | ICD-10-CM | POA: Insufficient documentation

## 2014-06-11 DIAGNOSIS — I469 Cardiac arrest, cause unspecified: Secondary | ICD-10-CM | POA: Diagnosis present

## 2014-06-11 NOTE — Discharge Summary (Signed)
Death Summary  Albert Hansen IDP:[id number] DOB: 1930-02-08 DOA: 06/05/2014  PCP: Jilda Panda, MD PCP/Office notified: No  Admit date: 05/18/2014 Date of Death: 05-17-2014  Final Diagnoses:  Principal Problem:   PEA (Pulseless electrical activity) Active Problems:   Chronic respiratory failure   GERD (gastroesophageal reflux disease)   Sepsis   Hypotension   Cellulitis   Acute kidney injury   Elevated troponin   Immunosuppressed status   Septic shock   Acute on chronic respiratory failure, unspecified whether with hypoxia or hypercapnia   Adrenal insufficiency   COPD exacerbation   Diabetes type 2, uncontrolled  Sepsis/septic shock; -Most likely secondary to cellulitis secondary to cat bite/scratch -Dr. Johnnye Sima (Infectious disease) consulted and recommended Unasyn + vancomycin will continue -If patient does not continue to improve may require MRI of hand and wrist -Blood cultures pending  Hypotension:  -Most likely multifactorial to include septic shock, adrenal insufficiency  -Resolved  - Hold all antihypertensives, continue IV fluid hydration, obtain cortisol level - Continue IV Solu-Cortef 50 mg TID  Adrenal insufficiency (patient's home dose prednisone 15 mg daily) -Patient has been on chronic steroids for many years secondary to COPD/BOOP -Continue Solu-Cortef  Acute on Chronic respiratory failure  -Multifactorial sepsis, hypotension, COPD exacerbation -chronic respiratory failure on 4 L O2 [place] at home - Continue IV antibiotics, scheduled bronchodilators, incentive spirometry, IV steroids  COPD exacerbation -See acute on chronic respiratory failure  GERD (gastroesophageal reflux disease) - Continue PPI  Diabetes mellitus type II uncontrolled  -2023/05/16 hemoglobin A1c = 8.1  -Insulin NPH 10 units BID -Resistant SSI  Acute kidney injury:  -Likely secondary to hypertension, and uncontrolled diabetes  - Continue normal saline at 125 ml/hr  -Avoid all  nephrotoxic medication; if creatinine not improving in the a.m. will change vancomycin to linezolid  Elevated troponin - Likely due to demand ischemia from septic shock  - Cannot give beta blockers or ACEI due to hypotension and acute renal insufficiency - serial cardiac enzymes; elevated but trending down -2-D echocardiogram; showed diastolic dysfunction  PEA -22:23 on 05/17/2014-Patient PEA CPR performed for 25 minutes unsuccessfully     History of present illness:  Patient is 80 year old WM PMHx diabetes mellitus, insulin-dependent, COPD, hypertension, GERD, BOOP with chronic respiratory failure on 4 L O2 [place] and dyspnea presented to ED with above complaints. Patient reported weakness and stated that he noticed redness and swelling to his left arm. He reported 3-4 day history of fevers and malaise, generalized weakness and he was bit by the cat on the left hand. Patient was bit by his cat 3 days ago on the left hand. Patient reported his symptoms started after the cat bite. In the ED, patient was noted to be tachycardiac with heart rate in 130s, BP in low 70s.  Patient also has generalized skin cancer and is following dermatology, Dr. Allyson Sabal. The patient has history of COPD, noticed increasing shortness of breath since yesterday with chills, O2 sats this morning were 92%, patient's wife reported his blood sugar was 45 this morning and she gave him a honey bun. Due to concern for sepsis, CCM was consulted in ED who discussed in detail with the patient regarding septic shock likely due to cellulitis. Patient wished for no aggressive measures including CPR, ETT/cardioversion with his discussion with PCCM. Critical care requested admission to stepdown unit.  21:55 on 17-May-2014-NT states patient is acting different and moaning. CCMD called RN to state patient is in asystole. Upon arrival to room, patient is unresponsive, not breathing,  and his face was cyanotic. No pulse palpated. Code blue  initiated. Code team at bedside and ACLS started.   22:23 on 05/22/14-Patient PEA. CPR performed for 25 minutes. Multiple medications given with no effectiveness. Wife notified and on her way to the hospital. Patient asystole. Time of death called 22:23.     Time:  22:23.  Signed:  Dia Crawford, MD [facility] [phone]

## 2014-06-12 NOTE — Progress Notes (Signed)
[place] TEAM 1 - Stepdown/ICU TEAM Progress Note  Albert Hansen TKP:[id number] DOB: August 06, 1929 DOA: 06/11/2014 PCP: Jilda Panda, MD  Admit HPI / Brief Narrative: Patient is 80 year old WM PMHx diabetes mellitus, insulin-dependent, COPD, hypertension, GERD, BOOP with chronic respiratory failure on 4 L O2 [place] and dyspnea presented to ED with above complaints. Patient reported weakness and stated that he noticed redness and swelling to his left arm. He reported 3-4 day history of fevers and malaise, generalized weakness and he was bit by the cat on the left hand. Patient was bit by his cat 3 days ago on the left hand. Patient reported his symptoms started after the cat bite. In the ED, patient was noted to be tachycardiac with heart rate in 130s, BP in low 70s.  Patient also has generalized skin cancer and is following dermatology, Dr. Allyson Sabal. The patient has history of COPD, noticed increasing shortness of breath since yesterday with chills, O2 sats this morning were 92%, patient's wife reported his blood sugar was 45 this morning and she gave him a honey bun. Due to concern for sepsis, CCM was consulted in ED who discussed in detail with the patient regarding septic shock likely due to cellulitis. Patient wished for no aggressive measures including CPR, ETT/cardioversion with his discussion with PCCM. Critical care requested admission to stepdown unit.    HPI/Subjective: 12/4 A/O 4. Patient states left hand and arm pain have decreased,   Assessment/Plan: Sepsis/septic shock; -Most likely secondary to cellulitis secondary to cat bite/scratch -Dr. Johnnye Sima (Infectious disease) consulted and recommended Unasyn + vancomycin will continue -Patient improving clinically so most likely will not require MRI of hand and wrist, but will monitor closely -Blood cultures NGTD -Continues to be afebrile, and leukocytosis continues to trend down  Hypertension -Patient has responded to fluid and is no longer  hypotensive, increase patient's Toprol-XL to home dose  25 mg BID  Hypotension:  -Most likely multifactorial to include septic shock, adrenal insufficiency  -Resolved  - Hold all antihypertensives, continue IV fluid hydration - Decrease  IV Solu-Cortef 50 mg  To BID  Adrenal insufficiency (patient's home dose prednisone 15 mg daily) -Patient has been on chronic steroids for many years secondary to COPD/BOOP -See hypotension   Acute on Chronic respiratory failure  -Multifactorial sepsis, hypotension, COPD exacerbation -chronic respiratory failure on 4 L O2 [place] at home - Continue IV antibiotics, scheduled bronchodilators, incentive spirometry, IV steroids  COPD exacerbation -See acute on chronic respiratory failure  GERD (gastroesophageal reflux disease) - Continue Protonix 40 mg daily   Diabetes mellitus type II uncontrolled  -12/2 hemoglobin A1c = 8.1  -Increase Insulin NPH 30 units BID -Continue Resistant SSI -Diet switch to heart healthy/carb modified  -Began to titrate steroids down which should help with control of his CBGs   Acute kidney injury:  -Likely secondary to hypertension, and uncontrolled diabetes  - Resolving with aggressive hydration, Continue normal saline at 125 ml/hr  -Avoid all nephrotoxic medication;  Elevated troponin - Likly due to demand ischemia from septic shock  - Cannot ACEI due to acute renal insufficiency - serial cardiac enzymes trending down -Echocardiogram; shows mild diastolic dysfunction  Skin disorder -Desonide per his dermatologist; note hydrocortisone 1% substituted    Code Status: FULL Family Communication: no family present at time of exam Disposition Plan: Resolution of sepsis    Consultants: Dr. Johnnye Sima (Infectious disease)  Procedure/Significant Events: 12/3 echocardiogram;- LVEF= 65%- 70%. -(grade 1 diastolicdysfunction).   Culture 12/2 blood right antecubital 2 NGTD 12/2 urine  negative 12/2 MRSA by PCR  negative   Antibiotics: Unasyn 12/2>> Vancomycin>>   DVT prophylaxis: Subcutaneous heparin   Devices   LINES / TUBES:      Continuous Infusions: . sodium chloride 125 mL/hr at Jun 01, 2014 0444    Objective: VITAL SIGNS: Temp: 97.3 F (36.3 C) (12/04 0408) Temp Source: Oral (12/04 0408) BP: 150/89 mmHg (12/04 0408) Pulse Rate: 92 (12/04 0408) SPO2; FIO2:   Intake/Output Summary (Last 24 hours) at 2014-06-01 0809 Last data filed at Jun 01, 2014 0409  Gross per 24 hour  Intake   3395 ml  Output   1725 ml  Net   1670 ml     Exam: General: A/O 4, NAD, chronic mild Respiratory distress Lungs: Diffuse expiratory wheeze, bilateral apical rhonchi (improved from yesterday) Cardiovascular: Regular rate and rhythm without murmur gallop or rub normal S1 and S2 Abdomen: Nontender, nondistended, soft, bowel sounds positive, no rebound, no ascites, no appreciable mass Extremities: No significant cyanosis, clubbing, or edema bilateral lower extremities; left hand and arm erythema decreasing, tenderness to palpation decreasingr,swelling from resolving, not warm to the touch NOTE patient has an underlying skin disease  Data Reviewed: Basic Metabolic Panel:  Recent Labs Lab 05/14/2014 1237 05/14/14 0044  NA 143 135*  K 4.7 5.4*  CL 100 98  CO2 25 20  GLUCOSE 142* 445*  BUN 27* 35*  CREATININE 2.41* 2.51*  CALCIUM 9.0 7.8*   Liver Function Tests:  Recent Labs Lab 05/27/2014 1237  AST 16  ALT 10  ALKPHOS 51  BILITOT 0.8  PROT 6.4  ALBUMIN 3.0*   No results for input(s): LIPASE, AMYLASE in the last 168 hours. No results for input(s): AMMONIA in the last 168 hours. CBC:  Recent Labs Lab 05/23/2014 1237 05/14/14 0044  WBC 28.6* 21.8*  NEUTROABS 23.5*  --   HGB 14.7 13.0  HCT 46.2 41.0  MCV 91.1 90.9  PLT 218 175   Cardiac Enzymes:  Recent Labs Lab 05/22/2014 1909 05/14/14 0044 05/14/14 2218 06/01/2014 0251  TROPONINI 1.15* 2.79* 2.21* 2.32*   BNP (last 3  results)  Recent Labs  06/10/2014 1240  PROBNP 2851.0*   CBG:  Recent Labs Lab 05/14/14 1154 05/14/14 1545 05/14/14 2027 05/14/14 2327 06-01-2014 0407  GLUCAP 261* 296* 234* 206* 151*    Recent Results (from the past 240 hour(s))  Blood Culture (routine x 2)     Status: None (Preliminary result)   Collection Time: 06/09/2014 12:40 PM  Result Value Ref Range Status   Specimen Description BLOOD RIGHT ANTECUBITAL  Final   Special Requests BOTTLES DRAWN AEROBIC ONLY 10ML  Final   Culture  Setup Time   Final    06/01/2014 17:05 Performed at [facility]    Culture   Final           BLOOD CULTURE RECEIVED NO GROWTH TO DATE CULTURE WILL BE HELD FOR 5 DAYS BEFORE ISSUING A FINAL NEGATIVE REPORT Performed at [facility]    Report Status PENDING  Incomplete  Urine culture     Status: None   Collection Time: 05/22/2014  1:14 PM  Result Value Ref Range Status   Specimen Description URINE, CATHETERIZED  Final   Special Requests NONE  Final   Culture  Setup Time   Final    05/25/2014 14:01 Performed at [place] Performed at [facility]   Final   Culture NO GROWTH Performed at [facility]  Final   Report Status 05/14/2014 FINAL  Final  Blood Culture (routine x 2)     Status: None (Preliminary result)   Collection Time: 06/05/2014  1:25 PM  Result Value Ref Range Status   Specimen Description BLOOD RIGHT ANTECUBITAL  Final   Special Requests BOTTLES DRAWN AEROBIC ONLY 6ML  Final   Culture  Setup Time   Final    06/06/2014 21:17 Performed at [facility]    Culture   Final           BLOOD CULTURE RECEIVED NO GROWTH TO DATE CULTURE WILL BE HELD FOR 5 DAYS BEFORE ISSUING A FINAL NEGATIVE REPORT Performed at [facility]    Report Status PENDING  Incomplete  MRSA PCR Screening     Status: None   Collection Time: 06/10/2014  5:22 PM  Result Value Ref Range Status   MRSA by PCR NEGATIVE NEGATIVE  Final    Comment:        The GeneXpert MRSA Assay (FDA approved for NASAL specimens only), is one component of a comprehensive MRSA colonization surveillance program. It is not intended to diagnose MRSA infection nor to guide or monitor treatment for MRSA infections.   Culture, blood (single)     Status: None (Preliminary result)   Collection Time: 06/01/2014  7:09 PM  Result Value Ref Range Status   Specimen Description BLOOD RIGHT HAND  Final   Special Requests BOTTLES DRAWN AEROBIC AND ANAEROBIC 3 CC  Final   Culture  Setup Time   Final    05/14/2014 00:49 Performed at [facility]    Culture   Final           BLOOD CULTURE RECEIVED NO GROWTH TO DATE CULTURE WILL BE HELD FOR 5 DAYS BEFORE ISSUING A FINAL NEGATIVE REPORT Performed at [facility]    Report Status PENDING  Incomplete     Studies:  Recent x-ray studies have been reviewed in detail by the Attending Physician  Scheduled Meds:  Scheduled Meds: . ampicillin-sulbactam (UNASYN) IV  3 g Intravenous Q12H  . budesonide-formoterol  2 puff Inhalation BID  . heparin  5,000 Units Subcutaneous 3 times per day  . hydrocortisone sod succinate (SOLU-CORTEF) inj  50 mg Intravenous Q8H  . insulin aspart  0-20 Units Subcutaneous 6 times per day  . insulin NPH Human  20 Units Subcutaneous BID AC & HS  . levalbuterol  1.25 mg Nebulization TID  . metoprolol  5 mg Intravenous Q4H  . pantoprazole  40 mg Oral Daily  . pravastatin  40 mg Oral Daily  . sodium chloride  3 mL Intravenous Q12H  . traZODone  50 mg Oral QHS  . vancomycin  1,000 mg Intravenous Q24H    Time spent on care of this patient: 40 mins   Allie Bossier , MD   [facility] Office  [phone] Pager [phone]  On-Call/Text Page:      [email or web address]      password [email or web address]  If 7PM-7AM, please contact night-coverage [email or web address] Password [email or web address] 2014/05/17, 8:09 AM   LOS: 2 days

## 2014-06-12 NOTE — Code Documentation (Signed)
CODE BLUE NOTE  Patient Name: Albert Hansen   MRN: [id number]   Date of Birth/ Sex: 1930-02-01 , male      Admission Date: 05/16/2014  Attending Provider: Allie Bossier, MD  Primary Diagnosis: Cellulitis [L03.90] SOB (shortness of breath) [R06.02] Sepsis affecting skin [L02.91]    Indication: Pt was in his usual state of health until this PM, when he was noted to be unresponsive and pulseless. Code blue was subsequently called. At the time of arrival on scene, ACLS protocol was underway. Internal medicine upper level resident Jessee Avers was present and running the code.    Technical Description:  - CPR performance duration:  25  minutes  - Was defibrillation or cardioversion used? No   - Was external pacer placed? No  - Was patient intubated pre/post CPR? Yes    Medications Administered: Y = Yes; Blank = No Amiodarone    Atropine    Calcium    Epinephrine  Y  Lidocaine    Magnesium    Norepinephrine    Phenylephrine    Sodium bicarbonate  Y  Vasopressin  Y  Other     Patient was persistently in PEA after 25 minutes of CPR and efforts were terminated.   Post CPR evaluation:  - Final Status - Was patient successfully resuscitated ? No   Miscellaneous Information:  - Time of death:  10/01/21  PM  - Primary team notified?  Yes  - Family Notified? Yes         Dimas Chyle, MD   06-05-2014, 10:32 PM

## 2014-06-12 NOTE — Progress Notes (Signed)
Chaplain responded to code and waited for family to arrive. Chaplain present while pt's code active. Chaplain provided grief support, facilitated storytelling, read scripture and offered prayer. Pt wife concerned about "what do I do now?" Chaplain encouraged focus on the present and identified ways that she was already "doing something" by grieving. Chaplain communicated funeral home, [employer], to pt nurse. Family has left the floor and has opted to go home and make phone calls to extended family.   2014/05/23 2300  Clinical Encounter Type  Visited With Family;Health care provider  Visit Type Code;Death  Spiritual Encounters  Spiritual Needs Sacred text;Grief support;Emotional;Prayer  Stress Factors  Family Stress Factors Loss;Loss of control  Kumiko Fishman, Barbette Hair, Chaplain May 23, 2014 11:41 PM

## 2014-06-12 NOTE — Evaluation (Signed)
Clinical/Bedside Swallow Evaluation Patient Details  Name: Albert Hansen MRN: [id number] Date of Birth: [birth year]  Today's Date: June 02, 2014 Time: 0950-1007 SLP Time Calculation (min) (ACUTE ONLY): 17 min  Past Medical History:  Past Medical History  Diagnosis Date  . Cancer     skin  . Diabetes mellitus without complication   . Asthma   . COPD (chronic obstructive pulmonary disease)   . Hypertension   . GERD (gastroesophageal reflux disease)   . BOOP (bronchiolitis obliterans with organizing pneumonia)   . Pulmonary nodules   . Dyspnea   . Chronic respiratory failure    Past Surgical History: History reviewed. No pertinent past surgical history. HPI:  Pt is an 81 year old male with a history of COPD, GERD, chronic respiratory failure. He is admitted with 3-4 days of fever and malaise following a cat bite with resulting sepsis. His CXR is clear. Pt noted to have cough with PO intake by RN.    Assessment / Plan / Recommendation Clinical Impression  Pt demonstrates adequate swallow function. No evidence of dysphagia obseved. Recommend pt continue current diet. SLP to sign off.     Aspiration Risk  Mild    Diet Recommendation Regular;Thin liquid   Liquid Administration via: Cup;Straw Medication Administration: Whole meds with liquid Supervision: Patient able to self feed Postural Changes and/or Swallow Maneuvers: Out of bed for meals    Other  Recommendations Oral Care Recommendations: Oral care BID   Follow Up Recommendations  None    Frequency and Duration        Pertinent Vitals/Pain NA    SLP Swallow Goals     Swallow Study Prior Functional Status       General HPI: Pt is an 81 year old male with a history of COPD, GERD, chronic respiratory failure. He is admitted with 3-4 days of fever and malaise following a cat bite with resulting sepsis. His CXR is clear. Pt noted to have cough with PO intake by RN.  Type of Study: Bedside swallow evaluation Previous  Swallow Assessment: none Diet Prior to this Study: Regular;Thin liquids Temperature Spikes Noted: No Respiratory Status: Nasal cannula History of Recent Intubation: No Behavior/Cognition: Alert;Cooperative;Pleasant mood Oral Cavity - Dentition: Adequate natural dentition Self-Feeding Abilities: Able to feed self Patient Positioning: Upright in chair Baseline Vocal Quality: Clear Volitional Cough: Strong Volitional Swallow: Able to elicit    Oral/Motor/Sensory Function Overall Oral Motor/Sensory Function: Appears within functional limits for tasks assessed   Ice Chips     Thin Liquid Thin Liquid: Within functional limits    Nectar Thick Nectar Thick Liquid: Not tested   Honey Thick Honey Thick Liquid: Not tested   Puree Puree: Within functional limits   Solid   GO    Solid: Within functional limits      [facility], MA CCC-SLP [phone]  Waymon Laser, Katherene Ponto 06/02/14,10:10 AM

## 2014-06-12 NOTE — Progress Notes (Signed)
INFECTIOUS DISEASE PROGRESS NOTE  ID: Albert Hansen is a 82 y.o. male with  Principal Problem:   Sepsis Active Problems:   Chronic respiratory failure   GERD (gastroesophageal reflux disease)   Hypotension   Cellulitis   Acute kidney injury   Elevated troponin   Immunosuppressed status   Septic shock   Acute on chronic respiratory failure, unspecified whether with hypoxia or hypercapnia   Adrenal insufficiency   COPD exacerbation   Diabetes type 2, uncontrolled  Subjective: Improved ROM of LUE.   Abtx:  Anti-infectives    Start     Dose/Rate Route Frequency Ordered Stop   05/14/14 1400  vancomycin (VANCOCIN) IVPB 1000 mg/200 mL premix     1,000 mg200 mL/hr over 60 Minutes Intravenous Every 24 hours 05/18/2014 1419     06/01/2014 2000  piperacillin-tazobactam (ZOSYN) IVPB 3.375 g  Status:  Discontinued     3.375 g12.5 mL/hr over 240 Minutes Intravenous Every 8 hours 06/01/2014 1419 05/14/2014 1451   06/04/2014 2000  Ampicillin-Sulbactam (UNASYN) 3 g in sodium chloride 0.9 % 100 mL IVPB     3 g100 mL/hr over 60 Minutes Intravenous Every 12 hours 05/21/2014 1507     06/03/2014 1400  piperacillin-tazobactam (ZOSYN) IVPB 3.375 g     3.375 g100 mL/hr over 30 Minutes Intravenous  Once 05/31/2014 1323 05/27/2014 1420   05/29/2014 1330  vancomycin (VANCOCIN) 1,500 mg in sodium chloride 0.9 % 500 mL IVPB     1,500 mg250 mL/hr over 120 Minutes Intravenous  Once 05/12/2014 1323 06/09/2014 1614      Medications:  Scheduled: . ampicillin-sulbactam (UNASYN) IV  3 g Intravenous Q12H  . budesonide-formoterol  2 puff Inhalation BID  . heparin  5,000 Units Subcutaneous 3 times per day  . hydrocortisone sod succinate (SOLU-CORTEF) inj  50 mg Intravenous Q8H  . insulin aspart  0-20 Units Subcutaneous 6 times per day  . insulin NPH Human  20 Units Subcutaneous BID AC & HS  . levalbuterol  1.25 mg Nebulization TID  . metoprolol  5 mg Intravenous Q4H  . metoprolol succinate  12.5 mg Oral BID  . pantoprazole  40  mg Oral Daily  . pravastatin  40 mg Oral Daily  . sodium chloride  3 mL Intravenous Q12H  . traZODone  50 mg Oral QHS  . vancomycin  1,000 mg Intravenous Q24H    Objective: Vital signs in last 24 hours: Temp:  [97.3 F (36.3 C)-98.5 F (36.9 C)] 97.9 F (36.6 C) (12/04 0730) Pulse Rate:  [92-110] 92 (12/04 0408) Resp:  [15-26] 19 (12/04 0408) BP: (109-160)/(79-98) 150/89 mmHg (12/04 0408) SpO2:  [88 %-96 %] 95 % (12/04 0809) Weight:  [95.4 kg (210 lb 5.1 oz)] 95.4 kg (210 lb 5.1 oz) (12/04 0408)   General appearance: alert, cooperative and no distress Resp: clear to auscultation bilaterally Cardio: regular rate and rhythm GI: normal findings: bowel sounds normal and soft, non-tender Extremities: improved ROM of LUE. no chnage in edema or erythema.   Lab Results  Recent Labs  05/14/14 0044 Jun 06, 2014 0915  WBC 21.8* 15.1*  HGB 13.0 12.9*  HCT 41.0 40.2  NA 135* 141  K 5.4* 4.7  CL 98 105  CO2 20 21  BUN 35* 35*  CREATININE 2.51* 1.99*   Liver Panel  Recent Labs  05/12/2014 1237 2014-06-06 0915  PROT 6.4 6.1  ALBUMIN 3.0* 2.6*  AST 16 19  ALT 10 13  ALKPHOS 51 46  BILITOT 0.8 0.3  Sedimentation Rate No results for input(s): ESRSEDRATE in the last 72 hours. C-Reactive Protein No results for input(s): CRP in the last 72 hours.  Microbiology: Recent Results (from the past 240 hour(s))  Blood Culture (routine x 2)     Status: None (Preliminary result)   Collection Time: 05/30/2014 12:40 PM  Result Value Ref Range Status   Specimen Description BLOOD RIGHT ANTECUBITAL  Final   Special Requests BOTTLES DRAWN AEROBIC ONLY 10ML  Final   Culture  Setup Time   Final    05/31/2014 17:05 Performed at [facility]    Culture   Final           BLOOD CULTURE RECEIVED NO GROWTH TO DATE CULTURE WILL BE HELD FOR 5 DAYS BEFORE ISSUING A FINAL NEGATIVE REPORT Performed at [facility]    Report Status PENDING  Incomplete  Urine culture     Status: None    Collection Time: 06/08/2014  1:14 PM  Result Value Ref Range Status   Specimen Description URINE, CATHETERIZED  Final   Special Requests NONE  Final   Culture  Setup Time   Final    05/12/2014 14:01 Performed at [place] Performed at [facility]   Final   Culture NO GROWTH Performed at [facility]   Final   Report Status 05/14/2014 FINAL  Final  Blood Culture (routine x 2)     Status: None (Preliminary result)   Collection Time: 05/18/2014  1:25 PM  Result Value Ref Range Status   Specimen Description BLOOD RIGHT ANTECUBITAL  Final   Special Requests BOTTLES DRAWN AEROBIC ONLY 6ML  Final   Culture  Setup Time   Final    06/09/2014 21:17 Performed at [facility]    Culture   Final           BLOOD CULTURE RECEIVED NO GROWTH TO DATE CULTURE WILL BE HELD FOR 5 DAYS BEFORE ISSUING A FINAL NEGATIVE REPORT Performed at [facility]    Report Status PENDING  Incomplete  MRSA PCR Screening     Status: None   Collection Time: 05/12/2014  5:22 PM  Result Value Ref Range Status   MRSA by PCR NEGATIVE NEGATIVE Final    Comment:        The GeneXpert MRSA Assay (FDA approved for NASAL specimens only), is one component of a comprehensive MRSA colonization surveillance program. It is not intended to diagnose MRSA infection nor to guide or monitor treatment for MRSA infections.   Culture, blood (single)     Status: None (Preliminary result)   Collection Time: 05/19/2014  7:09 PM  Result Value Ref Range Status   Specimen Description BLOOD RIGHT HAND  Final   Special Requests BOTTLES DRAWN AEROBIC AND ANAEROBIC 3 CC  Final   Culture  Setup Time   Final    05/14/2014 00:49 Performed at [facility]    Culture   Final           BLOOD CULTURE RECEIVED NO GROWTH TO DATE CULTURE WILL BE HELD FOR 5 DAYS BEFORE ISSUING A FINAL NEGATIVE REPORT Performed at [facility]    Report Status PENDING  Incomplete     Studies/Results: Dg Chest Port 1 View  05/12/2014   CLINICAL DATA:  Fever. Shortness of breath and tachycardia. ICD10: R 06.02  EXAM: PORTABLE CHEST - 1 VIEW  COMPARISON:  09/23/2013 chest radiograph.  03/11/2014 CT.  FINDINGS: Apical lordotic positioning. Midline trachea. Cardiomegaly accentuated by AP portable technique. Mediastinal contours otherwise within normal limits. No pleural effusion or pneumothorax. Clear lungs.  IMPRESSION: Cardiomegaly without congestive failure.   Electronically Signed   By: Abigail Miyamoto M.D.   On: 05/28/2014 13:54   Dg Hand Complete Left  05/14/2014   CLINICAL DATA:  Scratched and hit by a cat 4-5 days ago. Pain at the base of the first metacarpal.  EXAM: LEFT HAND - COMPLETE 3+ VIEW  COMPARISON:  None.  FINDINGS: The joint spaces are maintained. No acute bony findings or radiopaque foreign body. Mild DIP and PIP joint degenerative changes and mild degenerative changes at the carpal metacarpal joint of the thumb.  IMPRESSION: No acute bony findings or radiopaque foreign body.   Electronically Signed   By: Kalman Jewels M.D.   On: 05/29/2014 16:29     Assessment/Plan: Sepsis Cat bite Cellulitis + troponins Immunosuppression DM2 CRI  Total days of antibiotics: 3 vanco/unasyn  Arm slightly better Again, if worsening would have hand eval and check MRI.  Cr and WBC better. Troponin still positive. BCx ngtd.  I am worried about the edema and underlying poor skin quality in general. Will contninue to watch.          [name] Infectious Diseases (pager) [phone] [email or web address] 2014/06/03, 10:39 AM  LOS: 2 days

## 2014-06-12 NOTE — Progress Notes (Signed)
Medicare Important Message given to patient.

## 2014-06-12 NOTE — Progress Notes (Signed)
21:55-NT states patient is acting different and moaning. CCMD called RN to state patient is in asystole. Upon arrival to room, patient is unresponsive, not breathing, and his face was cyanotic. No pulse palpated. Code blue initiated. Code team at bedside and ACLS started.    22:23-Patient PEA. Partial code status, no CPR or intubation. Multiple medications given with no effectiveness. Wife notified and on her way to the hospital. Patient asystole. Time of death called 22:23.  M.Forest Gleason, RN

## 2014-06-12 DEATH — deceased

## 2014-11-18 IMAGING — CT CT CHEST HIGH RESOLUTION W/O CM
3 of 7 series · 13 of 36 positions shown, 15 images · non-contrast
Comparison: 07/23/2012.

CLINICAL DATA: Worsening shortness of breath.

EXAM:
CT CHEST WITHOUT CONTRAST
TECHNIQUE: Multidetector CT imaging of the chest was performed following the
standard protocol without intravenous contrast. High resolution
imaging of the lungs, as well as inspiratory and expiratory imaging,
was performed.

[Series 5: lung · axial · 0.79mm/px · z∈[-231,-21]mm · 7 of 57 slices shown, 9 images]
[im 8/57  mediastinal]
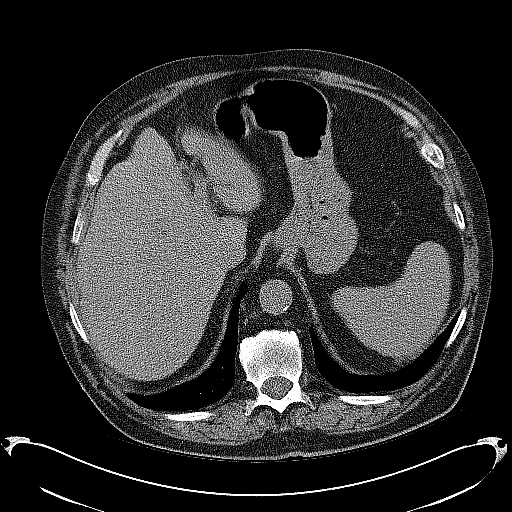
[im 8/57  lung]
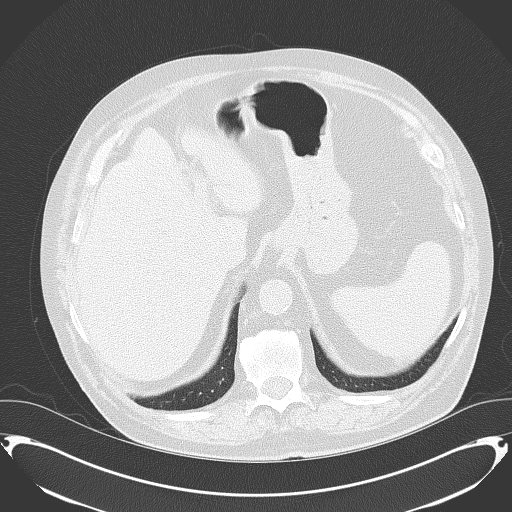
[im 15/57  lung]
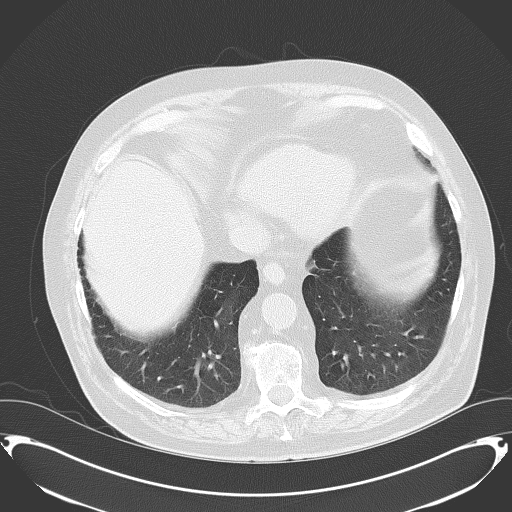
[im 22/57  lung]
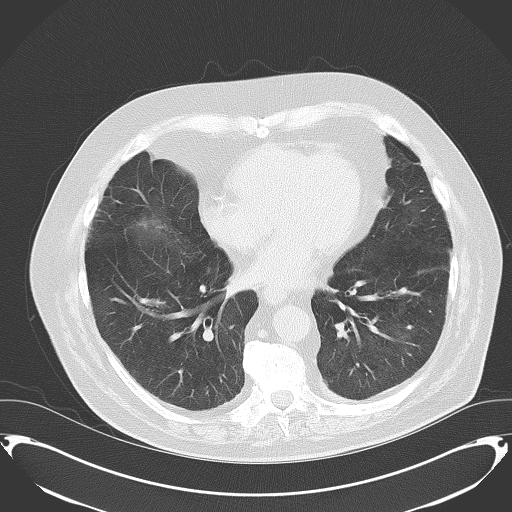
[im 29/57  lung]
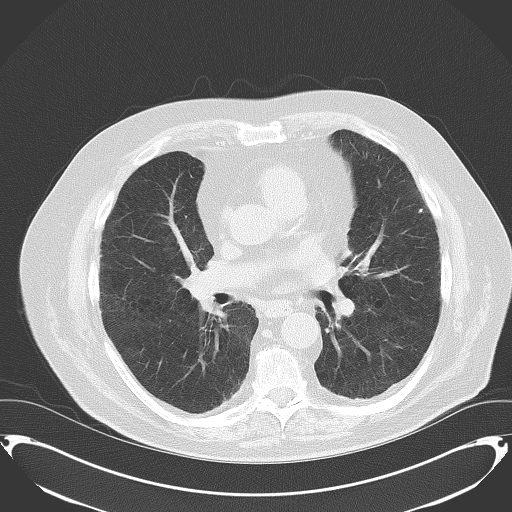
[im 36/57  mediastinal]
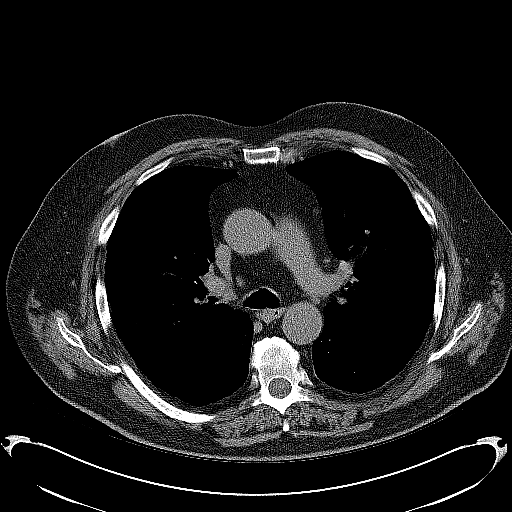
[im 36/57  lung]
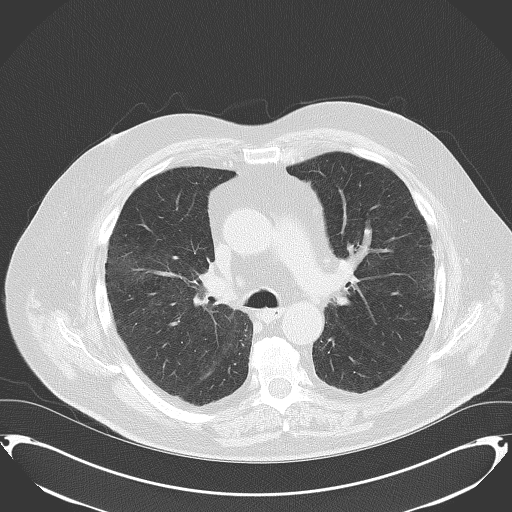
[im 43/57  lung]
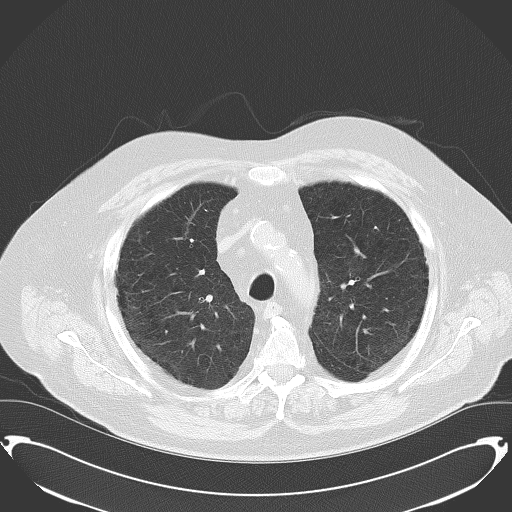
[im 50/57  lung]
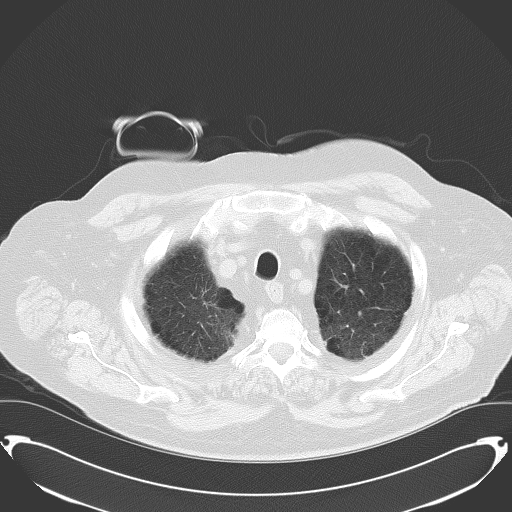

[Series 8: hires retro entire lungs · axial · 0.79mm/px · z∈[-202,-62]mm · 3 of 30 slices shown]
[im 8/30  lung]
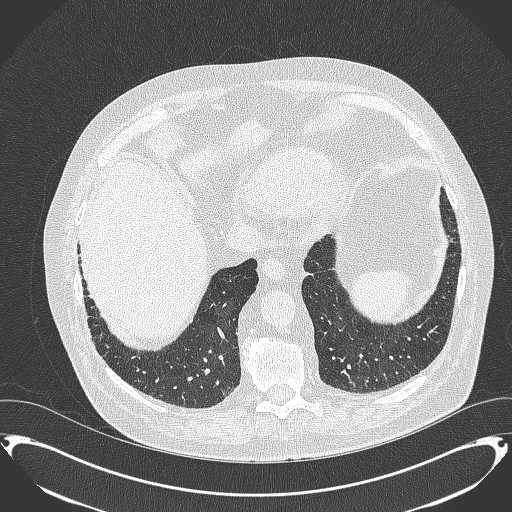
[im 15/30  lung]
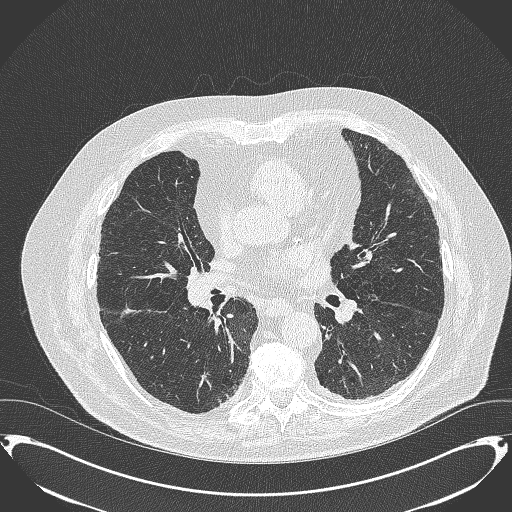
[im 22/30  lung]
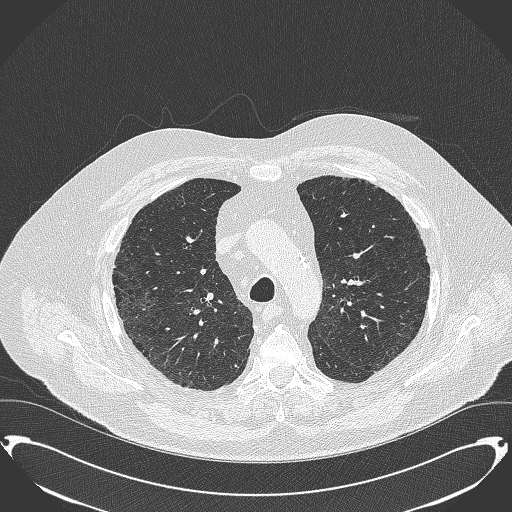

[Series 602: cor · coronal · 0.79mm/px · 3 of 122 slices shown]
[im 25/122  lung]
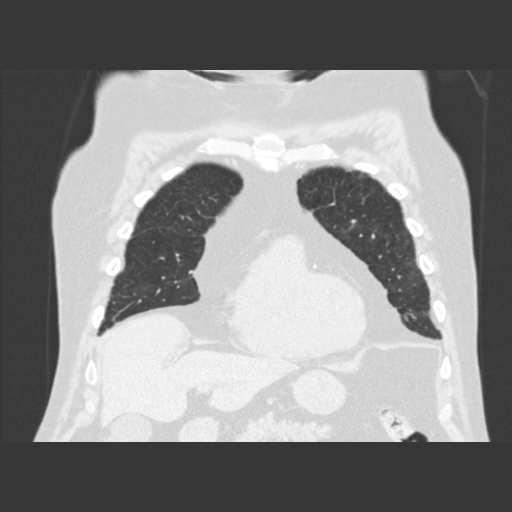
[im 49/122  lung]
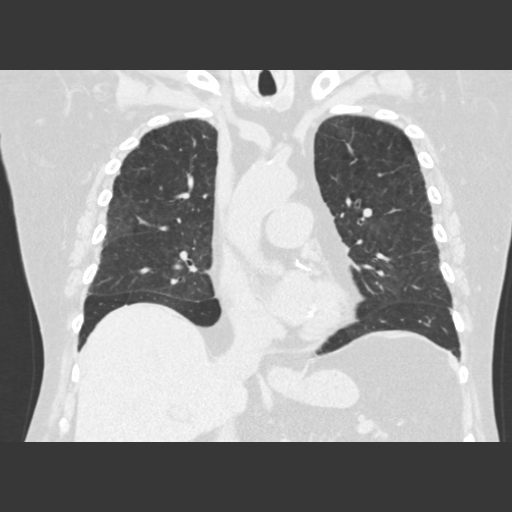
[im 73/122  lung]
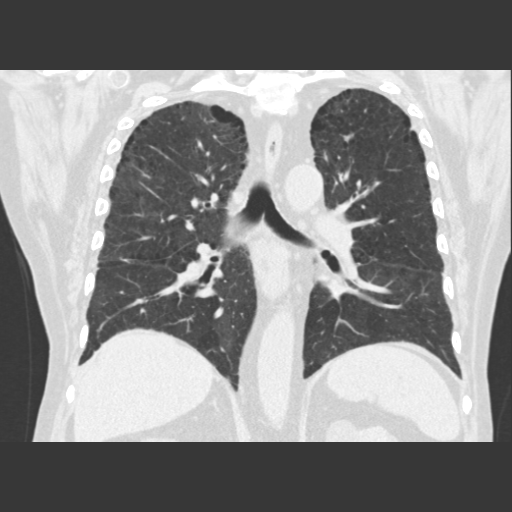

[13 of 36 positions shown; findings below may reference images not displayed]

FINDINGS: No pathologically enlarged mediastinal or axillary lymph nodes.
Hilar regions are difficult to definitively evaluate without IV
contrast but appear grossly unremarkable. Three-vessel coronary
artery calcification. Heart size normal. No pericardial effusion.

Centrilobular and paraseptal emphysema. No subpleural reticulation,
traction bronchiectasis/ bronchiolectasis, architectural distortion
or honeycombing. Subpleural scarring in the apices. Calcified
granuloma is in the left upper lobe. Scattered subpleural nodular
densities measure up to 6 mm, stable from 07/23/2012 and therefore
considered benign. No pleural fluid. Airway is unremarkable. No air
trapping.

Incidental imaging of the upper abdomen shows the visualized portion
of the liver to be grossly unremarkable. Stones are seen in the
gallbladder. Adrenal glands are unremarkable. Low-attenuation
lesions in the kidneys measure up to 4.1 cm on the left,
incompletely imaged. Visualized portions of the spleen, pancreas,
stomach and bowel are grossly unremarkable. No upper abdominal
adenopathy.

No worrisome lytic or sclerotic lesions. Degenerative changes are
seen in the spine.
IMPRESSION: 1. Centrilobular and paraseptal emphysema. No evidence of
superimposed fibrotic interstitial lung disease.
2. Three-vessel coronary artery calcification.
3. Cholelithiasis.
